# Patient Record
Sex: Male | Born: 1985
Health system: Southern US, Community
[De-identification: ages and names within clinical notes are randomized; demographics above are authoritative.]

## PROBLEM LIST (undated history)

## (undated) ENCOUNTER — Emergency Department: Discharge status: Absent without leave | Payer: Self-pay

## (undated) ENCOUNTER — Emergency Department (HOSPITAL_COMMUNITY): Admission: EM | Payer: Self-pay

## (undated) DIAGNOSIS — J40 Bronchitis, not specified as acute or chronic: Secondary | ICD-10-CM

## (undated) DIAGNOSIS — F1910 Other psychoactive substance abuse, uncomplicated: Secondary | ICD-10-CM

## (undated) DIAGNOSIS — F1010 Alcohol abuse, uncomplicated: Secondary | ICD-10-CM

## (undated) HISTORY — PX: HAND SURGERY: SHX662

---
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
## 2007-06-16 ENCOUNTER — Emergency Department (HOSPITAL_COMMUNITY): Admission: EM | Admit: 2007-06-16 | Discharge: 2007-06-16 | Payer: Self-pay | Admitting: Emergency Medicine

## 2011-02-16 LAB — URINALYSIS, ROUTINE W REFLEX MICROSCOPIC
Glucose, UA: NEGATIVE
Hgb urine dipstick: NEGATIVE
Ketones, ur: NEGATIVE
Nitrite: NEGATIVE
Protein, ur: NEGATIVE
Specific Gravity, Urine: 1.03
Urobilinogen, UA: 2 — ABNORMAL HIGH
pH: 6

## 2011-02-16 LAB — URINE MICROSCOPIC-ADD ON

## 2011-02-16 LAB — GC/CHLAMYDIA PROBE AMP, GENITAL
Chlamydia, DNA Probe: POSITIVE — AB
GC Probe Amp, Genital: POSITIVE — AB

## 2018-01-24 ENCOUNTER — Encounter (HOSPITAL_BASED_OUTPATIENT_CLINIC_OR_DEPARTMENT_OTHER): Payer: Self-pay | Admitting: Emergency Medicine

## 2018-01-24 ENCOUNTER — Emergency Department (HOSPITAL_BASED_OUTPATIENT_CLINIC_OR_DEPARTMENT_OTHER): Payer: Self-pay

## 2018-01-24 ENCOUNTER — Emergency Department (HOSPITAL_BASED_OUTPATIENT_CLINIC_OR_DEPARTMENT_OTHER)
Admission: EM | Admit: 2018-01-24 | Discharge: 2018-01-24 | Disposition: A | Discharge status: Home / Self Care | Payer: Self-pay | Attending: Emergency Medicine | Admitting: Emergency Medicine

## 2018-01-24 ENCOUNTER — Other Ambulatory Visit: Payer: Self-pay

## 2018-01-24 DIAGNOSIS — F17200 Nicotine dependence, unspecified, uncomplicated: Secondary | ICD-10-CM | POA: Insufficient documentation

## 2018-01-24 DIAGNOSIS — J209 Acute bronchitis, unspecified: Secondary | ICD-10-CM | POA: Insufficient documentation

## 2018-01-24 MED ORDER — ALBUTEROL SULFATE HFA 108 (90 BASE) MCG/ACT IN AERS
2.0000 | INHALATION_SPRAY | RESPIRATORY_TRACT | Status: DC | PRN
  Administered 2018-01-24: 2 via RESPIRATORY_TRACT
  Filled 2018-01-24: qty 6.7

## 2018-01-24 MED ORDER — PREDNISONE 10 MG PO TABS: 20.0000 mg | ORAL_TABLET | Freq: Two times a day (BID) | ORAL | 0 refills | Status: DC

## 2018-01-24 MED ORDER — AZITHROMYCIN 250 MG PO TABS: ORAL_TABLET | ORAL | 0 refills | Status: DC

## 2018-01-24 MED ORDER — ALBUTEROL SULFATE (2.5 MG/3ML) 0.083% IN NEBU
5.0000 mg | INHALATION_SOLUTION | Freq: Once | RESPIRATORY_TRACT | Status: AC
  Administered 2018-01-24: 5 mg via RESPIRATORY_TRACT
  Filled 2018-01-24: qty 6

## 2018-01-24 NOTE — ED Notes (Signed)
Patient transported to X-ray 

## 2018-01-24 NOTE — ED Triage Notes (Signed)
Pt reports shob with cough x 4 hours. Pt reports he had inhaler from a month ago, but has lost it.

## 2018-01-24 NOTE — ED Provider Notes (Signed)
[facility] EMERGENCY DEPARTMENT Provider Note   CSN: [id number] Arrival date & time: 01/24/18  0228     History   Chief Complaint Chief Complaint  Patient presents with  . Shortness of Breath    HPI William Miranda is a 32 y.o. male.  Patient is a 32 year old male with no significant medical history.  He presents today with complaints of cough and shortness of breath.  This originally began one month ago and was seen at that time at a hospital in [place].  He was given an inhaler an prednisone.  He was feeling better until this evening when his symptoms returned.  He denies productive cough, fevers.  He has lost he inhaler he was given in [place].  The history is provided by the patient.  Shortness of Breath  This is a recurrent problem. The average episode lasts 1 hour. The problem occurs continuously.The problem has not changed since onset.Associated symptoms include cough. Pertinent negatives include no fever, no sputum production and no chest pain. He has tried nothing for the symptoms.    No past medical history on file.  There are no active problems to display for this patient.   History reviewed. No pertinent surgical history.      Home Medications    Prior to Admission medications   Not on File    Family History No family history on file.  Social History Social History   Tobacco Use  . Smoking status: Current Every Day Smoker  Substance Use Topics  . Alcohol use: Yes  . Drug use: Not on file     Allergies   Patient has no known allergies.   Review of Systems Review of Systems  Constitutional: Negative for fever.  Respiratory: Positive for cough and shortness of breath. Negative for sputum production.   Cardiovascular: Negative for chest pain.  All other systems reviewed and are negative.    Physical Exam Updated Vital Signs BP (!) 114/91 (BP Location: Right Arm)   Pulse 84   Temp 98.4 F (36.9 C) (Oral)   Resp 16   SpO2  100%   Physical Exam  Constitutional: He is oriented to person, place, and time. He appears well-developed and well-nourished. No distress.  HENT:  Head: Normocephalic and atraumatic.  Mouth/Throat: Oropharynx is clear and moist.  Neck: Normal range of motion. Neck supple.  Cardiovascular: Normal rate and regular rhythm. Exam reveals no friction rub.  No murmur heard. Pulmonary/Chest: Effort normal and breath sounds normal. No respiratory distress. He has no wheezes. He has no rales.  Abdominal: Soft. Bowel sounds are normal. He exhibits no distension. There is no tenderness.  Musculoskeletal: Normal range of motion. He exhibits no edema.  Neurological: He is alert and oriented to person, place, and time. Coordination normal.  Skin: Skin is warm and dry. He is not diaphoretic.  Nursing note and vitals reviewed.    ED Treatments / Results  Labs (all labs ordered are listed, but only abnormal results are displayed) Labs Reviewed - No data to display  EKG None  Radiology No results found.  Procedures Procedures (including critical care time)  Medications Ordered in ED Medications  albuterol (PROVENTIL) (2.5 MG/3ML) 0.083% nebulizer solution 5 mg (5 mg Nebulization Given 01/24/18 0250)     Initial Impression / Assessment and Plan / ED Course  I have reviewed the triage vital signs and the nursing notes.  Pertinent labs & imaging results that were available during my care of the  patient were reviewed by me and considered in my medical decision making (see chart for details).  Patient feeling better after breathing treatment.  His chest x-ray is clear.  It appears as though he had only partial treatment of his prior bronchitic illness.  He will be prescribed antibiotics, steroids, and given an additional inhaler.  To return as needed for any problems.  Final Clinical Impressions(s) / ED Diagnoses   Final diagnoses:  None    ED Discharge Orders    None       Geoffery Lyonselo,  Jerni Selmer, MD 01/24/18 [phone]

## 2018-01-24 NOTE — Discharge Instructions (Addendum)
Zithromax and prednisone as prescribed.  Albuterol inhaler: 2 puffs every 4 hours as needed for difficulty breathing.  Follow-up with your primary doctor if symptoms not improving in the next week.

## 2018-05-18 ENCOUNTER — Encounter (HOSPITAL_COMMUNITY): Payer: Self-pay | Admitting: Emergency Medicine

## 2018-05-18 ENCOUNTER — Emergency Department (HOSPITAL_COMMUNITY)
Admission: EM | Admit: 2018-05-18 | Discharge: 2018-05-18 | Disposition: A | Discharge status: Home / Self Care | Payer: Self-pay | Attending: Emergency Medicine | Admitting: Emergency Medicine

## 2018-05-18 ENCOUNTER — Other Ambulatory Visit: Payer: Self-pay

## 2018-05-18 DIAGNOSIS — L03011 Cellulitis of right finger: Secondary | ICD-10-CM

## 2018-05-18 DIAGNOSIS — Z79899 Other long term (current) drug therapy: Secondary | ICD-10-CM | POA: Insufficient documentation

## 2018-05-18 DIAGNOSIS — F17200 Nicotine dependence, unspecified, uncomplicated: Secondary | ICD-10-CM | POA: Insufficient documentation

## 2018-05-18 MED ORDER — CEPHALEXIN 500 MG PO CAPS: 500.0000 mg | ORAL_CAPSULE | Freq: Three times a day (TID) | ORAL | 0 refills | Status: DC

## 2018-05-18 NOTE — Discharge Instructions (Signed)
Soak your finger and antibacterial soapy water multiple times a day.  Keflex as prescribed until all gone for infection.  Follow-up or return if not improving or worsening.

## 2018-05-18 NOTE — ED Provider Notes (Signed)
[facility]-EMERGENCY DEPT Provider Note   CSN: [id number] Arrival date & time: 05/18/18  1943     History   Chief Complaint Chief Complaint  Patient presents with  . Hand Pain    right middle finger  . Insect Bite    HPI William Miranda is a 32 y.o. male.  HPI William Miranda is a 32 y.o. male presents to emergency department with complaint of pain and swelling to the right middle finger.  He states symptoms began several days ago.  He states over the last several days the finger has become more painful and swollen.  There is no drainage.  He is concerned this may be a spider bite.  He denies any fever or chills.  He denies any difficulty bending or moving his finger.  He denies history of the same.  History reviewed. No pertinent past medical history.  There are no active problems to display for this patient.   History reviewed. No pertinent surgical history.      Home Medications    Prior to Admission medications   Medication Sig Start Date End Date Taking? Authorizing Provider  azithromycin (ZITHROMAX Z-PAK) 250 MG tablet 2 po day one, then 1 daily x 4 days 01/24/18   Geoffery Lyonselo, Douglas, MD  cephALEXin (KEFLEX) 500 MG capsule Take 1 capsule (500 mg total) by mouth 3 (three) times daily. 05/18/18   Abdullah Rizzi, Lemont Fillersatyana, PA-C  predniSONE (DELTASONE) 10 MG tablet Take 2 tablets (20 mg total) by mouth 2 (two) times daily. 01/24/18   Geoffery Lyonselo, Douglas, MD    Family History History reviewed. No pertinent family history.  Social History Social History   Tobacco Use  . Smoking status: Current Every Day Smoker  . Smokeless tobacco: Former Engineer, waterUser  Substance Use Topics  . Alcohol use: Yes  . Drug use: Yes     Allergies   Patient has no known allergies.   Review of Systems Review of Systems  Constitutional: Negative for chills and fever.  Musculoskeletal: Positive for arthralgias.  Skin: Positive for color change and wound.  Neurological: Negative for  weakness and numbness.     Physical Exam Updated Vital Signs BP 124/82 (BP Location: Left Arm)   Pulse 95   Temp 98 F (36.7 C) (Oral)   Resp 16   Ht 5\' 11"  (1.803 m)   Wt 90.7 kg   SpO2 98%   BMI 27.89 kg/m   Physical Exam Vitals signs and nursing note reviewed.  Constitutional:      General: He is not in acute distress.    Appearance: He is well-developed.  Eyes:     Conjunctiva/sclera: Conjunctivae normal.  Neck:     Musculoskeletal: Neck supple.  Cardiovascular:     Rate and Rhythm: Normal rate.  Pulmonary:     Effort: No respiratory distress.  Abdominal:     General: There is no distension.  Musculoskeletal:     Comments: Paronychia to the right middle finger.  Finger pad is soft, no concern for felon.  Full range of motion of the finger at each joint.  Cap refill less than 2 seconds distally  Skin:    General: Skin is warm and dry.      ED Treatments / Results  Labs (all labs ordered are listed, but only abnormal results are displayed) Labs Reviewed - No data to display  EKG None  Radiology No results found.  Procedures Drain paronychia Date/Time: 05/18/2018 8:19 PM Performed by: Jaynie CrumbleKirichenko, Carlis Burnsworth, PA-C Authorized  by: Jaynie CrumbleKirichenko, Demetreus Lothamer, PA-C  Consent: Verbal consent obtained. Consent given by: patient Patient understanding: patient states understanding of the procedure being performed Site marked: the operative site was marked Patient identity confirmed: verbally with patient Time out: Immediately prior to procedure a "time out" was called to verify the correct patient, procedure, equipment, support staff and site/side marked as required. Preparation: Patient was prepped and draped in the usual sterile fashion. Local anesthesia used: no  Anesthesia: Local anesthesia used: no  Sedation: Patient sedated: no  Patient tolerance: Patient tolerated the procedure well with no immediate complications    (including critical care  time)  Medications Ordered in ED Medications - No data to display   Initial Impression / Assessment and Plan / ED Course  I have reviewed the triage vital signs and the nursing notes.  Pertinent labs & imaging results that were available during my care of the patient were reviewed by me and considered in my medical decision making (see chart for details).     Patient in emergency department with paronychia to the right middle finger.  Paronychia was drained in emergency department with a large amount of purulent drainage.  Home with wound care, soaks, antibiotic.  Follow-up or return as needed if not improving or worsening.  Patient is otherwise afebrile, nontoxic-appearing.  Stable for discharge home.  Vitals:   05/18/18 1947  BP: 124/82  Pulse: 95  Resp: 16  Temp: 98 F (36.7 C)  TempSrc: Oral  SpO2: 98%  Weight: 90.7 kg  Height: 5\' 11"  (1.803 m)     Final Clinical Impressions(s) / ED Diagnoses   Final diagnoses:  Paronychia of finger of right hand    ED Discharge Orders         Ordered    cephALEXin (KEFLEX) 500 MG capsule  3 times daily     05/18/18 2015           Jaynie CrumbleKirichenko, Nalee Lightle, PA-C 05/18/18 2020    Jacalyn LefevreHaviland, Julie, MD 05/18/18 2307

## 2018-05-18 NOTE — ED Notes (Signed)
ED Provider at bedside. 

## 2018-05-18 NOTE — ED Triage Notes (Signed)
Patient complaining of being bit by a spider on middle finger. The top of patients middle finger is swollen and painful.

## 2018-06-22 ENCOUNTER — Encounter (HOSPITAL_COMMUNITY): Payer: Self-pay | Admitting: *Deleted

## 2018-06-22 ENCOUNTER — Emergency Department (HOSPITAL_COMMUNITY)
Admission: EM | Admit: 2018-06-22 | Discharge: 2018-06-22 | Disposition: A | Discharge status: Home / Self Care | Payer: Self-pay | Attending: Emergency Medicine | Admitting: Emergency Medicine

## 2018-06-22 ENCOUNTER — Emergency Department (HOSPITAL_COMMUNITY): Payer: Self-pay

## 2018-06-22 ENCOUNTER — Other Ambulatory Visit: Payer: Self-pay

## 2018-06-22 DIAGNOSIS — M25512 Pain in left shoulder: Secondary | ICD-10-CM | POA: Insufficient documentation

## 2018-06-22 DIAGNOSIS — Y929 Unspecified place or not applicable: Secondary | ICD-10-CM | POA: Insufficient documentation

## 2018-06-22 DIAGNOSIS — F17290 Nicotine dependence, other tobacco product, uncomplicated: Secondary | ICD-10-CM | POA: Insufficient documentation

## 2018-06-22 DIAGNOSIS — Y999 Unspecified external cause status: Secondary | ICD-10-CM | POA: Insufficient documentation

## 2018-06-22 DIAGNOSIS — W010XXD Fall on same level from slipping, tripping and stumbling without subsequent striking against object, subsequent encounter: Secondary | ICD-10-CM | POA: Insufficient documentation

## 2018-06-22 DIAGNOSIS — Y9302 Activity, running: Secondary | ICD-10-CM | POA: Insufficient documentation

## 2018-06-22 MED ORDER — ACETAMINOPHEN 500 MG PO TABS: 500.0000 mg | ORAL_TABLET | Freq: Four times a day (QID) | ORAL | 0 refills | Status: DC | PRN

## 2018-06-22 MED ORDER — IBUPROFEN 600 MG PO TABS: 600.0000 mg | ORAL_TABLET | Freq: Four times a day (QID) | ORAL | 0 refills | Status: DC | PRN

## 2018-06-22 NOTE — ED Notes (Signed)
Patient states that in the summer he had fail. He states that it started hurting 2 days ago. Patient states that he has not taken anything for it. Patient states that it just got worse.

## 2018-06-22 NOTE — Discharge Instructions (Signed)
1. Medications: Alternate 600 mg of ibuprofen and [phone] mg of Tylenol every 3 hours as needed for pain. Do not exceed 4000 mg of Tylenol daily.  Take ibuprofen with food to avoid upset stomach issues.  2. Treatment: rest, ice, elevate and use shoulder sling (but be sure to take your arm out of it 2-3 times daily and stretch to avoid muscle stiffness), drink plenty of fluids, gentle stretching (see attached exercises but you can also YouTube search shoulder or acromioclavicular physical therapy exercises) 3. Follow Up: Please followup with orthopedics as directed or your PCP in 1 week if no improvement for discussion of your diagnoses and further evaluation after today's visit; if you do not have a primary care doctor use the resource guide provided to find one; Please return to the ER for worsening symptoms or other concerns such as worsening swelling, redness of the skin, fevers, loss of pulses, or loss of feeling

## 2018-06-22 NOTE — ED Provider Notes (Signed)
[facility]-EMERGENCY DEPT Provider Note   CSN: [id number] Arrival date & time: 06/22/18  4665     History   Chief Complaint Chief Complaint  Patient presents with  . Shoulder Pain    left    HPI William Miranda is a 33 y.o. male with no significant past medical history presents for evaluation of acute onset, intermittent left shoulder pain for 2 days.  Patient reports that approximately 6 months ago he fell while running with his hands behind his back, landing on the anterior aspect of his left shoulder.  He did not have any long-lasting pain at that time but 2 days ago developed pain to the anterior aspect of the left shoulder.  Pain is sharp, occurs with any upward movement/flexion of the left shoulder.  He denies fevers, chest pain or shortness of breath, or neck pain.  No numbness, tingling, or weakness.  He has not tried anything for his symptoms.  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  The history is provided by the patient.    History reviewed. No pertinent past medical history.  There are no active problems to display for this patient.   Past Surgical History:  Procedure Laterality Date  . HAND SURGERY     right        Home Medications    Prior to Admission medications   Medication Sig Start Date End Date Taking? Authorizing Provider  acetaminophen (TYLENOL) 500 MG tablet Take 1 tablet (500 mg total) by mouth every 6 (six) hours as needed. 06/22/18   Eusebio Blazejewski A, PA-C  ibuprofen (ADVIL,MOTRIN) 600 MG tablet Take 1 tablet (600 mg total) by mouth every 6 (six) hours as needed. 06/22/18   Jeanie Sewer, PA-C    Family History No family history on file.  Social History Social History   Tobacco Use  . Smoking status: Current Every Day Smoker    Packs/day: 0.50    Types: Cigars  . Smokeless tobacco: Never Used  Substance Use Topics  . Alcohol use: Not Currently  . Drug use: Yes    Types: Marijuana     Allergies     Patient has no allergy information on record.   Review of Systems Review of Systems  Constitutional: Negative for fever.  Respiratory: Negative for shortness of breath.   Cardiovascular: Negative for chest pain.  Musculoskeletal: Positive for arthralgias.  Neurological: Negative for weakness and numbness.  All other systems reviewed and are negative.    Physical Exam Updated Vital Signs BP (!) 116/95 (BP Location: Left Arm)   Pulse 71   Temp 98.2 F (36.8 C) (Oral)   Resp 16   Ht 5\' 11"  (1.803 m)   Wt 90.7 kg   SpO2 100%   BMI 27.89 kg/m   Physical Exam Vitals signs and nursing note reviewed.  Constitutional:      General: He is not in acute distress.    Appearance: He is well-developed.  HENT:     Head: Normocephalic and atraumatic.  Eyes:     General:        Right eye: No discharge.        Left eye: No discharge.     Conjunctiva/sclera: Conjunctivae normal.  Neck:     Musculoskeletal: Normal range of motion and neck supple. No neck rigidity.     Vascular: No JVD.     Trachea: No tracheal deviation.  Cardiovascular:     Rate and Rhythm: Normal rate.  Pulses: Normal pulses.     Comments: 2+ radial pulses bilaterally Pulmonary:     Effort: Pulmonary effort is normal.  Abdominal:     General: There is no distension.  Musculoskeletal: Normal range of motion.        General: Tenderness present.     Comments: Tenderness to palpation overlying the left acromioclavicular joint.  No erythema, warmth, deformity, or crepitus noted to the left shoulder.  Normal active and passive range of motion of the left shoulder with pain elicited with flexion and abduction but not internal rotation.  Negative empty can sign, negative Hawkins impingement test.  Positive crossover test.  5/5 strength of BUE major muscle groups  Lymphadenopathy:     Cervical: No cervical adenopathy.  Skin:    Findings: No erythema.  Neurological:     Mental Status: He is alert.     Comments:  Fluent speech, no facial droop, sensation intact to soft touch of bilateral upper extremities.  Psychiatric:        Behavior: Behavior normal.      ED Treatments / Results  Labs (all labs ordered are listed, but only abnormal results are displayed) Labs Reviewed - No data to display  EKG None  Radiology Dg Shoulder Left  Result Date: 06/22/2018 CLINICAL DATA:  Patient fell landing on left shoulder in July, 2019 during arrest. Patient 2 weeks ago started having generalized left shoulder pain without known injury. EXAM: LEFT SHOULDER - 2+ VIEW COMPARISON:  None. FINDINGS: Osteoarthritis of the acromioclavicular joint with sclerosis of the distal clavicle and minimal spurring noted about the [facility] joint. No joint dislocation or fracture. No intra-articular loose bodies. The adjacent ribs and lung are nonacute. IMPRESSION: AC joint osteoarthritis. No acute osseous abnormality. Electronically Signed   By: Tollie Ethavid  Kwon M.D.   On: 06/22/2018 03:12    Procedures Procedures (including critical care time)  Medications Ordered in ED Medications - No data to display   Initial Impression / Assessment and Plan / ED Course  I have reviewed the triage vital signs and the nursing notes.  Pertinent labs & imaging results that were available during my care of the patient were reviewed by me and considered in my medical decision making (see chart for details).     Patient presenting for evaluation of left shoulder pain for 2 days. Fall 6 months ago. He is afebrile, vital signs are stable.  He is nontoxic in appearance.  He is neurovascularly intact.  Pain is focal overlying the left acromioclavicular joint and radiographs show evidence of AC joint osteoarthritis but no evidence of acute osseous abnormality including fracture or dislocation.  Doubt septic joint, osteomyelitis, or DVT.  Conservative therapy indicated and discussed with patient.  He was placed in a shoulder sling for comfort but advised to  do some gentle stretching to avoid muscle stiffness.  Recommend follow-up with orthopedist or PCP for reevaluation of symptoms.  Discussed strict ED return precautions. Pt verbalized understanding of and agreement with plan and is safe for discharge home at this time.   Final Clinical Impressions(s) / ED Diagnoses   Final diagnoses:  Arthralgia of left acromioclavicular joint  Acute pain of left shoulder    ED Discharge Orders         Ordered    ibuprofen (ADVIL,MOTRIN) 600 MG tablet  Every 6 hours PRN     06/22/18 0627    acetaminophen (TYLENOL) 500 MG tablet  Every 6 hours PRN     06/22/18 [id number]  Jeanie Sewer, PA-C 06/22/18 7412    Derwood Kaplan, MD 06/26/18 1510

## 2018-06-22 NOTE — ED Triage Notes (Signed)
Pt c/o left shoulder pain x 2 days, denies trauma/injury.

## 2018-07-13 ENCOUNTER — Encounter (HOSPITAL_COMMUNITY): Payer: Self-pay

## 2018-07-13 ENCOUNTER — Emergency Department (HOSPITAL_COMMUNITY)
Admission: EM | Admit: 2018-07-13 | Discharge: 2018-07-13 | Disposition: A | Discharge status: Home / Self Care | Payer: Self-pay | Attending: Emergency Medicine | Admitting: Emergency Medicine

## 2018-07-13 DIAGNOSIS — Z79899 Other long term (current) drug therapy: Secondary | ICD-10-CM | POA: Insufficient documentation

## 2018-07-13 DIAGNOSIS — R062 Wheezing: Secondary | ICD-10-CM | POA: Insufficient documentation

## 2018-07-13 DIAGNOSIS — F17210 Nicotine dependence, cigarettes, uncomplicated: Secondary | ICD-10-CM | POA: Insufficient documentation

## 2018-07-13 MED ORDER — ALBUTEROL SULFATE HFA 108 (90 BASE) MCG/ACT IN AERS
2.0000 | INHALATION_SPRAY | Freq: Four times a day (QID) | RESPIRATORY_TRACT | Status: DC | PRN
  Administered 2018-07-13: 2 via RESPIRATORY_TRACT
  Filled 2018-07-13: qty 6.7

## 2018-07-13 NOTE — ED Provider Notes (Signed)
[facility] EMERGENCY DEPARTMENT Provider Note   CSN: [id number] Arrival date & time: 07/13/18  1703     History   Chief Complaint Chief Complaint  Patient presents with  . Cough    HPI Jarom Rosenstock is a 33 y.o. male.  Patient is a 33 year old male who presents requesting a refill of his albuterol.  He states over the last few years he has noticed some wheezing from time to time.  He says it got worse a couple times during the summer and was diagnosed with bronchitis and he was dispensed an albuterol inhaler which she has been using with some improvement in symptoms.  He never did take his antibiotics which were prescribed previously.  He says that he started using his albuterol inhaler again couple weeks ago due to some wheezing but he ran out of it and is requesting a new one.  He denies any current shortness of breath.  No chest pain.  He is a smoker.  He does not have a primary care physician but is interested in obtaining one within the next 2 to 3 weeks when his insurance kicks in.  He denies any leg pain or swelling.  No fevers.     History reviewed. No pertinent past medical history.  There are no active problems to display for this patient.   History reviewed. No pertinent surgical history.      Home Medications    Prior to Admission medications   Medication Sig Start Date End Date Taking? Authorizing Provider  azithromycin (ZITHROMAX Z-PAK) 250 MG tablet 2 po day one, then 1 daily x 4 days 01/24/18   Geoffery Lyons, MD  cephALEXin (KEFLEX) 500 MG capsule Take 1 capsule (500 mg total) by mouth 3 (three) times daily. 05/18/18   Kirichenko, Lemont Fillers, PA-C  predniSONE (DELTASONE) 10 MG tablet Take 2 tablets (20 mg total) by mouth 2 (two) times daily. 01/24/18   Geoffery Lyons, MD    Family History History reviewed. No pertinent family history.  Social History Social History   Tobacco Use  . Smoking status: Current Every Day Smoker  . Smokeless  tobacco: Former Engineer, water Use Topics  . Alcohol use: Yes  . Drug use: Yes     Allergies   Patient has no known allergies.   Review of Systems Review of Systems  Constitutional: Negative for chills, diaphoresis, fatigue and fever.  HENT: Negative for congestion, rhinorrhea and sneezing.   Eyes: Negative.   Respiratory: Positive for shortness of breath and wheezing. Negative for cough and chest tightness.   Cardiovascular: Negative for chest pain and leg swelling.  Gastrointestinal: Negative for abdominal pain, blood in stool, diarrhea, nausea and vomiting.  Genitourinary: Negative for difficulty urinating, flank pain, frequency and hematuria.  Musculoskeletal: Negative for arthralgias and back pain.  Skin: Negative for rash.  Neurological: Negative for dizziness, speech difficulty, weakness, numbness and headaches.     Physical Exam Updated Vital Signs BP 126/77 (BP Location: Right Arm)   Pulse 88   Temp 98.8 F (37.1 C) (Oral)   Resp 17   Ht 5\' 10"  (1.778 m)   Wt 89.8 kg   SpO2 100%   BMI 28.41 kg/m   Physical Exam Constitutional:      Appearance: He is well-developed.  HENT:     Head: Normocephalic and atraumatic.  Eyes:     Pupils: Pupils are equal, round, and reactive to light.  Neck:     Musculoskeletal: Normal range of  motion and neck supple.  Cardiovascular:     Rate and Rhythm: Normal rate and regular rhythm.     Heart sounds: Normal heart sounds.  Pulmonary:     Effort: Pulmonary effort is normal. No respiratory distress.     Breath sounds: Normal breath sounds. No wheezing or rales.  Chest:     Chest wall: No tenderness.  Abdominal:     General: Bowel sounds are normal.     Palpations: Abdomen is soft.     Tenderness: There is no abdominal tenderness. There is no guarding or rebound.  Musculoskeletal: Normal range of motion.  Lymphadenopathy:     Cervical: No cervical adenopathy.  Skin:    General: Skin is warm and dry.     Findings: No  rash.  Neurological:     Mental Status: He is alert and oriented to person, place, and time.      ED Treatments / Results  Labs (all labs ordered are listed, but only abnormal results are displayed) Labs Reviewed - No data to display  EKG None  Radiology No results found.  Procedures Procedures (including critical care time)  Medications Ordered in ED Medications  albuterol (PROVENTIL HFA;VENTOLIN HFA) 108 (90 Base) MCG/ACT inhaler 2 puff (has no administration in time range)     Initial Impression / Assessment and Plan / ED Course  I have reviewed the triage vital signs and the nursing notes.  Pertinent labs & imaging results that were available during my care of the patient were reviewed by me and considered in my medical decision making (see chart for details).     Patient is a well-appearing male who presents with intermittent wheezing.  He does not have any symptoms of an acute infection.  His lungs are currently clear to auscultation.  I did counsel him in smoking cessation.  He was dispensed an albuterol inhaler.  He was encouraged to establish care with a PCP.  Return precautions were given.  Final Clinical Impressions(s) / ED Diagnoses   Final diagnoses:  Wheezing    ED Discharge Orders    None       Rolan Bucco, MD 07/13/18 1752

## 2018-07-13 NOTE — ED Triage Notes (Signed)
Pt reports dx of bronchitis in the summer, was prescribed inhaler and antibiotics. Pt reports never took antibiotics. Pt reports he didn't start using inhaler until 1.5 months ago. Pt reports sharing it with someone else and needs inhaler refill. Pt states he is having a cough and some SOB. Pt alert, oriented, and ambulatory.

## 2018-07-22 ENCOUNTER — Emergency Department (HOSPITAL_BASED_OUTPATIENT_CLINIC_OR_DEPARTMENT_OTHER)
Admission: EM | Admit: 2018-07-22 | Discharge: 2018-07-23 | Disposition: A | Discharge status: Home / Self Care | Payer: Self-pay | Attending: Emergency Medicine | Admitting: Emergency Medicine

## 2018-07-22 ENCOUNTER — Other Ambulatory Visit: Payer: Self-pay

## 2018-07-22 ENCOUNTER — Encounter (HOSPITAL_BASED_OUTPATIENT_CLINIC_OR_DEPARTMENT_OTHER): Payer: Self-pay | Admitting: *Deleted

## 2018-07-22 DIAGNOSIS — L0291 Cutaneous abscess, unspecified: Secondary | ICD-10-CM

## 2018-07-22 DIAGNOSIS — L739 Follicular disorder, unspecified: Secondary | ICD-10-CM

## 2018-07-22 DIAGNOSIS — F17290 Nicotine dependence, other tobacco product, uncomplicated: Secondary | ICD-10-CM | POA: Insufficient documentation

## 2018-07-22 DIAGNOSIS — Z79899 Other long term (current) drug therapy: Secondary | ICD-10-CM | POA: Insufficient documentation

## 2018-07-22 DIAGNOSIS — L0231 Cutaneous abscess of buttock: Secondary | ICD-10-CM | POA: Insufficient documentation

## 2018-07-22 HISTORY — DX: Bronchitis, not specified as acute or chronic: J40

## 2018-07-22 MED ORDER — LIDOCAINE-EPINEPHRINE (PF) 2 %-1:200000 IJ SOLN
INTRAMUSCULAR | Status: AC
  Filled 2018-07-22: qty 10

## 2018-07-22 MED ORDER — LIDOCAINE-EPINEPHRINE (PF) 2 %-1:200000 IJ SOLN
20.0000 mL | Freq: Once | INTRAMUSCULAR | Status: AC
  Administered 2018-07-22: 20 mL
  Filled 2018-07-22: qty 20

## 2018-07-22 NOTE — ED Triage Notes (Signed)
Pt reports 2 abscess on buttocks x 3-4 days

## 2018-07-22 NOTE — ED Provider Notes (Signed)
[facility] EMERGENCY DEPARTMENT Provider Note   CSN: [id number] Arrival date & time: 07/22/18  2103    History   Chief Complaint Chief Complaint  Patient presents with  . Abscess    HPI William Miranda is a 33 y.o. male who presents today for evaluation of multiple abscesses.  He reports that over the past week he has had multiple boils develop under his arms, on his buttocks and in his groin.  He reports that he was recently seen at [facility] where he had aspiration of possible abscess performed.  He reports that he was given a prescription for antibiotics however has not yet filled them.  He denies any fevers.  He denies any IV drug use.  He does report daily use of ecstasy.  No fevers.  No nausea vomiting or diarrhea.  He reports that otherwise he has been well.     HPI  Past Medical History:  Diagnosis Date  . Bronchitis     There are no active problems to display for this patient.   Past Surgical History:  Procedure Laterality Date  . HAND SURGERY          Home Medications    Prior to Admission medications   Medication Sig Start Date End Date Taking? Authorizing Provider  azithromycin (ZITHROMAX Z-PAK) 250 MG tablet 2 po day one, then 1 daily x 4 days 01/24/18   Geoffery Lyonselo, Douglas, MD  cephALEXin (KEFLEX) 500 MG capsule Take 1 capsule (500 mg total) by mouth 3 (three) times daily. 05/18/18   Kirichenko, Lemont Fillersatyana, PA-C  predniSONE (DELTASONE) 10 MG tablet Take 2 tablets (20 mg total) by mouth 2 (two) times daily. 01/24/18   Geoffery Lyonselo, Douglas, MD    Family History No family history on file.  Social History Social History   Tobacco Use  . Smoking status: Current Every Day Smoker    Types: Cigars  . Smokeless tobacco: Former Engineer, waterUser  Substance Use Topics  . Alcohol use: Yes    Comment: occasional  . Drug use: Yes    Comment: "x" pills     Allergies   Patient has no known allergies.   Review of Systems Review of Systems  Constitutional: Negative for  chills and fever.  Gastrointestinal: Negative for abdominal pain, nausea and vomiting.  Skin: Positive for color change and rash.  All other systems reviewed and are negative.    Physical Exam Updated Vital Signs BP (!) 135/99   Pulse 98   Temp 98.2 F (36.8 C) (Oral)   Resp 16   Ht 5' 10.5" (1.791 m)   Wt 90.7 kg   SpO2 100%   BMI 28.29 kg/m   Physical Exam Vitals signs and nursing note reviewed.  Constitutional:      General: He is not in acute distress.    Appearance: He is well-developed. He is not diaphoretic.     Comments: Initially very sleepy, once awakened was able to interact appropriately.  HENT:     Head: Normocephalic and atraumatic.  Eyes:     General: No scleral icterus.       Right eye: No discharge.        Left eye: No discharge.     Conjunctiva/sclera: Conjunctivae normal.  Neck:     Musculoskeletal: Normal range of motion.  Cardiovascular:     Rate and Rhythm: Normal rate and regular rhythm.     Pulses: Normal pulses.  Pulmonary:     Effort: Pulmonary effort is normal. No respiratory  distress.     Breath sounds: No stridor.  Abdominal:     General: There is no distension.  Musculoskeletal:        General: No deformity.  Skin:    General: Skin is warm and dry.     Comments: There are multiple areas of mild induration consistent with superficial infection.   The area that is most painful is on the left sided medial buttock which has a central pustule and approximately 4 x 4 area of induration with fluctuance approximately 1 cm in the middle.  Neurological:     General: No focal deficit present.     Mental Status: He is alert and oriented to person, place, and time.     Motor: No abnormal muscle tone.  Psychiatric:        Mood and Affect: Mood normal.        Behavior: Behavior normal.      ED Treatments / Results  Labs (all labs ordered are listed, but only abnormal results are displayed) Labs Reviewed - No data to  display  EKG None  Radiology No results found.  Procedures .Marland KitchenIncision and Drainage Date/Time: 07/23/2018 12:06 AM Performed by: Cristina Gong, PA-C Authorized by: Cristina Gong, PA-C   Consent:    Consent obtained:  Verbal   Consent given by:  Patient   Risks discussed:  Bleeding, incomplete drainage, pain and infection (Damage to other structures, need for additional procedures)   Alternatives discussed:  No treatment, alternative treatment and referral Location:    Type:  Abscess   Size:  1.5cm   Location:  Lower extremity   Lower extremity location:  Buttock   Buttock location:  L buttock Pre-procedure details:    Skin preparation:  Chloraprep Anesthesia (see MAR for exact dosages):    Anesthesia method:  Local infiltration   Local anesthetic:  Lidocaine 2% WITH epi Procedure type:    Complexity:  Complex Procedure details:    Incision types:  Stab incision   Incision depth:  Subcutaneous   Scalpel blade:  11   Wound management:  Probed and deloculated and irrigated with saline   Drainage:  Purulent   Drainage amount:  Scant   Wound treatment:  Wound left open   Packing materials:  None Post-procedure details:    Patient tolerance of procedure:  Tolerated well, no immediate complications Ultrasound ED Soft Tissue Date/Time: 07/23/2018 12:07 AM Performed by: Cristina Gong, PA-C Authorized by: Cristina Gong, PA-C   Procedure details:    Indications: localization of abscess     Transverse view:  Visualized   Longitudinal view:  Visualized   Images: archived   Location:    Location: buttocks     Side:  Left Findings:     abscess present    cellulitis present   (including critical care time)  Medications Ordered in ED Medications  lidocaine-EPINEPHrine (XYLOCAINE W/EPI) 2 %-1:200000 (PF) injection (  Canceled Entry 07/22/18 2357)  lidocaine-EPINEPHrine (XYLOCAINE W/EPI) 2 %-1:200000 (PF) injection 20 mL (20 mLs Infiltration Given  07/22/18 2257)     Initial Impression / Assessment and Plan / ED Course  I have reviewed the triage vital signs and the nursing notes.  Pertinent labs & imaging results that were available during my care of the patient were reviewed by me and considered in my medical decision making (see chart for details).       Patient with skin abscess amenable to incision and drainage.  Abscess was not  large enough to warrant packing or drain,  wound recheck in 2 days. Encouraged home warm soaks and flushing.  Mild signs of cellulitis is surrounding skin.  Will d/c to home.  He was previously given antibiotics yesterday for the same.  He reports that he still has this prescription.  Encouraged him to fill the prescription.  Given how he was initially drowsy with reports of not sleeping due to ecstasy use, I do not feel that narcotic pain medicine is in this patient's best interest.  Therefore recommended over-the-counter pain medicine and conservative care.  Return precautions were discussed with patient who states their understanding.  At the time of discharge patient denied any unaddressed complaints or concerns.  Patient is agreeable for discharge home.   Final Clinical Impressions(s) / ED Diagnoses   Final diagnoses:  Abscess  Folliculitis    ED Discharge Orders    None       Norman Clay 07/23/18 0018    Gilda Crease, MD 07/23/18 [phone]

## 2018-07-22 NOTE — Discharge Instructions (Signed)
Please get your prescriptions filled.  If you develop fevers or worsening symptoms please seek additional medical care and evaluation.  Please take Ibuprofen (Advil, motrin) and Tylenol (acetaminophen) to relieve your pain.  You may take up to 600 MG (3 pills) of normal strength ibuprofen every 8 hours as needed.  In between doses of ibuprofen you make take tylenol, up to 1,000 mg (two extra strength pills).  Do not take more than 3,000 mg tylenol in a 24 hour period.  Please check all medication labels as many medications such as pain and cold medications may contain tylenol.  Do not drink alcohol while taking these medications.  Do not take other NSAID'S while taking ibuprofen (such as aleve or naproxen).  Please take ibuprofen with food to decrease stomach upset.

## 2018-07-28 ENCOUNTER — Other Ambulatory Visit: Payer: Self-pay

## 2018-07-28 ENCOUNTER — Encounter (HOSPITAL_BASED_OUTPATIENT_CLINIC_OR_DEPARTMENT_OTHER): Payer: Self-pay | Admitting: Emergency Medicine

## 2018-07-28 ENCOUNTER — Emergency Department (HOSPITAL_BASED_OUTPATIENT_CLINIC_OR_DEPARTMENT_OTHER)
Admission: EM | Admit: 2018-07-28 | Discharge: 2018-07-28 | Disposition: A | Discharge status: Home / Self Care | Payer: Self-pay | Attending: Emergency Medicine | Admitting: Emergency Medicine

## 2018-07-28 ENCOUNTER — Emergency Department (HOSPITAL_BASED_OUTPATIENT_CLINIC_OR_DEPARTMENT_OTHER): Payer: Self-pay

## 2018-07-28 DIAGNOSIS — R0602 Shortness of breath: Secondary | ICD-10-CM | POA: Insufficient documentation

## 2018-07-28 DIAGNOSIS — Z5321 Procedure and treatment not carried out due to patient leaving prior to being seen by health care provider: Secondary | ICD-10-CM | POA: Insufficient documentation

## 2018-07-28 NOTE — ED Triage Notes (Signed)
Reports shortness of breath since last night.  Ambulatory to triage in NAD.  Speaking in full sentences without difficulty.

## 2018-07-28 NOTE — ED Notes (Signed)
Called x 3 no answer 

## 2018-07-28 NOTE — ED Notes (Signed)
Pt did not answer x 1 

## 2018-07-28 NOTE — ED Notes (Signed)
CALLED X 2 NO ANSWER ?

## 2018-07-29 ENCOUNTER — Other Ambulatory Visit: Payer: Self-pay

## 2018-07-29 DIAGNOSIS — E876 Hypokalemia: Secondary | ICD-10-CM | POA: Insufficient documentation

## 2018-07-29 DIAGNOSIS — J069 Acute upper respiratory infection, unspecified: Secondary | ICD-10-CM | POA: Insufficient documentation

## 2018-07-29 DIAGNOSIS — F17290 Nicotine dependence, other tobacco product, uncomplicated: Secondary | ICD-10-CM | POA: Insufficient documentation

## 2018-07-30 ENCOUNTER — Emergency Department (HOSPITAL_COMMUNITY): Payer: Self-pay

## 2018-07-30 ENCOUNTER — Emergency Department (HOSPITAL_COMMUNITY)
Admission: EM | Admit: 2018-07-30 | Discharge: 2018-07-30 | Disposition: A | Discharge status: Home / Self Care | Payer: Self-pay | Attending: Emergency Medicine | Admitting: Emergency Medicine

## 2018-07-30 ENCOUNTER — Other Ambulatory Visit: Payer: Self-pay

## 2018-07-30 ENCOUNTER — Encounter (HOSPITAL_COMMUNITY): Payer: Self-pay | Admitting: Emergency Medicine

## 2018-07-30 DIAGNOSIS — J069 Acute upper respiratory infection, unspecified: Secondary | ICD-10-CM

## 2018-07-30 DIAGNOSIS — E876 Hypokalemia: Secondary | ICD-10-CM

## 2018-07-30 LAB — BASIC METABOLIC PANEL
Anion gap: 9 (ref 5–15)
BUN: 15 mg/dL (ref 6–20)
CHLORIDE: 103 mmol/L (ref 98–111)
CO2: 23 mmol/L (ref 22–32)
CREATININE: 0.95 mg/dL (ref 0.61–1.24)
Calcium: 8.6 mg/dL — ABNORMAL LOW (ref 8.9–10.3)
GFR calc Af Amer: >60 mL/min (ref >60)
GFR calc non Af Amer: >60 mL/min (ref >60)
Glucose, Bld: 92 mg/dL (ref 70–99)
Potassium: 3.1 mmol/L — ABNORMAL LOW (ref 3.5–5.1)
SODIUM: 135 mmol/L (ref 135–145)

## 2018-07-30 LAB — CBC
HCT: 44 % (ref 39.0–52.0)
HEMOGLOBIN: 14.1 g/dL (ref 13.0–17.0)
MCH: 28.5 pg (ref 26.0–34.0)
MCHC: 32 g/dL (ref 30.0–36.0)
MCV: 89.1 fL (ref 80.0–100.0)
PLATELETS: 218 10*3/uL (ref 150–400)
RBC: 4.94 MIL/uL (ref 4.22–5.81)
RDW: 13 % (ref 11.5–15.5)
WBC: 6.6 10*3/uL (ref 4.0–10.5)
nRBC: 0 % (ref 0.0–0.2)

## 2018-07-30 MED ORDER — POTASSIUM CHLORIDE CRYS ER 20 MEQ PO TBCR
40.0000 meq | EXTENDED_RELEASE_TABLET | Freq: Once | ORAL | Status: AC
  Administered 2018-07-30: 40 meq via ORAL
  Filled 2018-07-30: qty 2

## 2018-07-30 MED ORDER — IBUPROFEN 800 MG PO TABS
800.0000 mg | ORAL_TABLET | Freq: Once | ORAL | Status: AC
  Administered 2018-07-30: 800 mg via ORAL
  Filled 2018-07-30: qty 1

## 2018-07-30 MED ORDER — BENZONATATE 100 MG PO CAPS
100.0000 mg | ORAL_CAPSULE | Freq: Once | ORAL | Status: AC
  Administered 2018-07-30: 100 mg via ORAL
  Filled 2018-07-30: qty 1

## 2018-07-30 MED ORDER — IBUPROFEN 600 MG PO TABS: 600.0000 mg | ORAL_TABLET | Freq: Four times a day (QID) | ORAL | 0 refills | Status: DC | PRN

## 2018-07-30 MED ORDER — BENZONATATE 100 MG PO CAPS: 100.0000 mg | ORAL_CAPSULE | Freq: Three times a day (TID) | ORAL | 0 refills | Status: DC | PRN

## 2018-07-30 NOTE — Discharge Instructions (Signed)
Your recent chest x-ray was reassuring and did not show any evidence of pneumonia.  Your blood work today was normal with the exception of a slightly low potassium.  You were given a potassium supplement in the emergency department prior to discharge.  We recommend Tylenol and ibuprofen for management of low-grade fever as well as body aches.  You have been prescribed Tessalon to take for cough management.  You may continue with other over-the-counter remedies, if desired.  Follow-up with a primary care doctor to ensure resolution of symptoms.
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
## 2018-07-30 NOTE — ED Triage Notes (Addendum)
Patient is complaining of diarrhea, nausea, generalized body aches, hot and cold, and feeling weak. Patient states this has been going on for 3 days. Patient has been dx with bronchitis.

## 2018-07-30 NOTE — ED Provider Notes (Signed)
[facility]-EMERGENCY DEPT Provider Note   CSN: [id number] Arrival date & time: 07/29/18  2355    History   Chief Complaint Chief Complaint  Patient presents with  . Fever  . Generalized Body Aches    HPI William Miranda is a 33 y.o. male.    33 year old male presents to the emergency department for evaluation of upper respiratory symptoms.  He has been experiencing fatigue as well as generalized body aches over the past 3 days.  Notes associated nasal congestion as well as cough.  He has been experiencing some discomfort in his central chest with coughing.  Also reports a few episodes of watery, nonbloody diarrhea.  He has been using Mucinex for symptoms with little relief.  Reports prior history of bronchitis over the summer.  Was prescribed antibiotics at this time, but did not complete them.  Denies any known sick contacts.  Endorsing subjective fever, but is afebrile in the ED.  Denies taking antipyretics prior to arrival.  The history is provided by the patient. No language interpreter was used.  Fever    Past Medical History:  Diagnosis Date  . Bronchitis     There are no active problems to display for this patient.   Past Surgical History:  Procedure Laterality Date  . HAND SURGERY          Home Medications    Prior to Admission medications   Medication Sig Start Date End Date Taking? Authorizing Provider  albuterol (PROVENTIL) (2.5 MG/3ML) 0.083% nebulizer solution Take 2.5 mg by nebulization every 6 (six) hours as needed for wheezing or shortness of breath.   Yes [provider]  PE-diphenhydrAMINE-DM-GG-APAP (MUCINEX FAST-MAX DAY/NIGHT PO) Take 30 mLs by mouth 2 (two) times daily.   Yes [provider]  benzonatate (TESSALON) 100 MG capsule Take 1 capsule (100 mg total) by mouth 3 (three) times daily as needed for cough. 07/30/18   Antony Madura, PA-C  ibuprofen (ADVIL,MOTRIN) 600 MG tablet Take 1 tablet (600 mg total) by  mouth every 6 (six) hours as needed for mild pain, moderate pain or cramping. 07/30/18   Antony Madura, PA-C    Family History History reviewed. No pertinent family history.  Social History Social History   Tobacco Use  . Smoking status: Current Every Day Smoker    Types: Cigars  . Smokeless tobacco: Former Engineer, water Use Topics  . Alcohol use: Yes    Comment: occasional  . Drug use: Yes    Comment: "x" pills     Allergies   Patient has no known allergies.   Review of Systems Review of Systems  Constitutional: Positive for fever.  Ten systems reviewed and are negative for acute change, except as noted in the HPI.    Physical Exam Updated Vital Signs BP (!) 130/93 (BP Location: Left Arm)   Pulse 93   Temp 100 F (37.8 C) (Oral)   Resp 17   Ht 5' 11.5" (1.816 m)   Wt 90.7 kg   SpO2 98%   BMI 27.51 kg/m   Physical Exam Vitals signs and nursing note reviewed.  Constitutional:      General: He is not in acute distress.    Appearance: He is well-developed. He is not diaphoretic.     Comments: Nontoxic appearing and in NAD  HENT:     Head: Normocephalic and atraumatic.     Nose: Congestion present. No rhinorrhea.     Mouth/Throat:     Comments: Oropharynx  clear.  Normal phonation.  Tolerating secretions without difficulty Eyes:     General: No scleral icterus.    Conjunctiva/sclera: Conjunctivae normal.  Neck:     Musculoskeletal: Normal range of motion.  Cardiovascular:     Rate and Rhythm: Normal rate and regular rhythm.     Pulses: Normal pulses.  Pulmonary:     Effort: Pulmonary effort is normal. No respiratory distress.     Breath sounds: No stridor. No wheezing, rhonchi or rales.     Comments: Sporadic dry cough.  Lungs clear to auscultation bilaterally Musculoskeletal: Normal range of motion.  Skin:    General: Skin is warm and dry.     Coloration: Skin is not pale.     Findings: No erythema or rash.  Neurological:     Mental Status: He is  alert and oriented to person, place, and time.     Coordination: Coordination normal.  Psychiatric:        Behavior: Behavior normal.      ED Treatments / Results  Labs (all labs ordered are listed, but only abnormal results are displayed) Labs Reviewed  BASIC METABOLIC PANEL - Abnormal; Notable for the following components:      Result Value   Potassium 3.1 (*)    Calcium 8.6 (*)    All other components within normal limits  CBC    EKG None  Radiology Dg Chest 2 View  Result Date: 07/28/2018 CLINICAL DATA:  Shortness of breath with fever and weakness for a few days. Nonsmoker. EXAM: CHEST - 2 VIEW COMPARISON:  Radiographs 01/24/2018 and 02/18/2017. FINDINGS: The heart size and mediastinal contours are normal. The lungs are clear. There is no pleural effusion or pneumothorax. No acute osseous findings are identified. IMPRESSION: Stable chest.  No active cardiopulmonary process. Electronically Signed   By: Carey Bullocks M.D.   On: 07/28/2018 11:39    Procedures Procedures (including critical care time)  Medications Ordered in ED Medications  potassium chloride SA (K-DUR,KLOR-CON) CR tablet 40 mEq (has no administration in time range)  ibuprofen (ADVIL,MOTRIN) tablet 800 mg (800 mg Oral Given 07/30/18 0222)  benzonatate (TESSALON) capsule 100 mg (100 mg Oral Given 07/30/18 0222)     Initial Impression / Assessment and Plan / ED Course  I have reviewed the triage vital signs and the nursing notes.  Pertinent labs & imaging results that were available during my care of the patient were reviewed by me and considered in my medical decision making (see chart for details).        Patient's CXR negative for acute infiltrate. No fever, hypoxia. Lungs CTAB on my assessment. Symptoms are consistent with URI, likely viral etiology. Discussed that antibiotics are not indicated for viral infections. Patient will be discharged with symptomatic treatment. Return precautions provided.  Patient discharged in stable condition with no unaddressed concerns.   Final Clinical Impressions(s) / ED Diagnoses   Final diagnoses:  URI with cough and congestion  Hypokalemia    ED Discharge Orders         Ordered    ibuprofen (ADVIL,MOTRIN) 600 MG tablet  Every 6 hours PRN     07/30/18 0303    benzonatate (TESSALON) 100 MG capsule  3 times daily PRN     07/30/18 0303           Antony Madura, PA-C 07/30/18 0306    Molpus, Jonny Ruiz, MD 07/30/18 [phone]

## 2018-08-19 ENCOUNTER — Other Ambulatory Visit: Payer: Self-pay

## 2018-08-19 ENCOUNTER — Encounter (HOSPITAL_COMMUNITY): Payer: Self-pay | Admitting: Emergency Medicine

## 2018-08-19 ENCOUNTER — Emergency Department (HOSPITAL_COMMUNITY)
Admission: EM | Admit: 2018-08-19 | Discharge: 2018-08-19 | Disposition: A | Discharge status: Home / Self Care | Payer: Self-pay | Attending: Emergency Medicine | Admitting: Emergency Medicine

## 2018-08-19 DIAGNOSIS — H5712 Ocular pain, left eye: Secondary | ICD-10-CM

## 2018-08-19 DIAGNOSIS — F17290 Nicotine dependence, other tobacco product, uncomplicated: Secondary | ICD-10-CM | POA: Insufficient documentation

## 2018-08-19 DIAGNOSIS — H538 Other visual disturbances: Secondary | ICD-10-CM

## 2018-08-19 MED ORDER — FLUORESCEIN SODIUM 1 MG OP STRP
1.0000 | ORAL_STRIP | Freq: Once | OPHTHALMIC | Status: AC
  Administered 2018-08-19: 1 via OPHTHALMIC
  Filled 2018-08-19: qty 1

## 2018-08-19 MED ORDER — OXYCODONE-ACETAMINOPHEN 5-325 MG PO TABS: 1.0000 | ORAL_TABLET | Freq: Four times a day (QID) | ORAL | 0 refills | Status: DC | PRN

## 2018-08-19 MED ORDER — TETRACAINE HCL 0.5 % OP SOLN
1.0000 [drp] | Freq: Once | OPHTHALMIC | Status: AC
  Administered 2018-08-19: 1 [drp] via OPHTHALMIC
  Filled 2018-08-19: qty 4

## 2018-08-19 MED ORDER — OXYCODONE-ACETAMINOPHEN 5-325 MG PO TABS
1.0000 | ORAL_TABLET | Freq: Once | ORAL | Status: AC
  Administered 2018-08-19: 1 via ORAL
  Filled 2018-08-19: qty 1

## 2018-08-19 NOTE — ED Notes (Signed)
Patient verbalizes understanding of discharge instructions. Opportunity for questioning and answers were provided. Armband removed by staff, pt discharged from ED. Ambulated out to lobby  

## 2018-08-19 NOTE — Discharge Instructions (Addendum)
Please use saline eyedrops, pain medication as needed.  Follow-up with Dr. Cammie Mcgee office today to schedule your appointment for tomorrow.  You may return to the emergency room if you develop any new or worsening signs or symptoms.

## 2018-08-19 NOTE — ED Notes (Signed)
Patient transported to CT 
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
## 2018-08-29 ENCOUNTER — Emergency Department (HOSPITAL_COMMUNITY)
Admission: EM | Admit: 2018-08-29 | Discharge: 2018-08-29 | Disposition: A | Discharge status: Home / Self Care | Payer: Self-pay | Attending: Emergency Medicine | Admitting: Emergency Medicine

## 2018-08-29 ENCOUNTER — Emergency Department (HOSPITAL_COMMUNITY): Payer: Self-pay

## 2018-08-29 ENCOUNTER — Encounter (HOSPITAL_COMMUNITY): Payer: Self-pay | Admitting: Emergency Medicine

## 2018-08-29 ENCOUNTER — Other Ambulatory Visit: Payer: Self-pay

## 2018-08-29 DIAGNOSIS — R0789 Other chest pain: Secondary | ICD-10-CM | POA: Insufficient documentation

## 2018-08-29 DIAGNOSIS — Z79899 Other long term (current) drug therapy: Secondary | ICD-10-CM | POA: Insufficient documentation

## 2018-08-29 DIAGNOSIS — R109 Unspecified abdominal pain: Secondary | ICD-10-CM | POA: Insufficient documentation

## 2018-08-29 DIAGNOSIS — F17210 Nicotine dependence, cigarettes, uncomplicated: Secondary | ICD-10-CM | POA: Insufficient documentation

## 2018-08-29 LAB — BASIC METABOLIC PANEL
Anion gap: 10 (ref 5–15)
BUN: 13 mg/dL (ref 6–20)
CO2: 23 mmol/L (ref 22–32)
Calcium: 9.4 mg/dL (ref 8.9–10.3)
Chloride: 105 mmol/L (ref 98–111)
Creatinine, Ser: 0.95 mg/dL (ref 0.61–1.24)
GFR calc Af Amer: >60 mL/min (ref >60)
GFR calc non Af Amer: >60 mL/min (ref >60)
Glucose, Bld: 85 mg/dL (ref 70–99)
Potassium: 3.7 mmol/L (ref 3.5–5.1)
Sodium: 138 mmol/L (ref 135–145)

## 2018-08-29 LAB — CBC WITH DIFFERENTIAL/PLATELET
Abs Immature Granulocytes: 0.07 10*3/uL (ref 0.00–0.07)
Basophils Absolute: 0.1 10*3/uL (ref 0.0–0.1)
Basophils Relative: 1 %
Eosinophils Absolute: 0.5 10*3/uL (ref 0.0–0.5)
Eosinophils Relative: 5 %
HCT: 43.9 % (ref 39.0–52.0)
Hemoglobin: 14.4 g/dL (ref 13.0–17.0)
Immature Granulocytes: 1 %
Lymphocytes Relative: 42 %
Lymphs Abs: 4.5 10*3/uL — ABNORMAL HIGH (ref 0.7–4.0)
MCH: 29 pg (ref 26.0–34.0)
MCHC: 32.8 g/dL (ref 30.0–36.0)
MCV: 88.5 fL (ref 80.0–100.0)
Monocytes Absolute: 1 10*3/uL (ref 0.1–1.0)
Monocytes Relative: 9 %
Neutro Abs: 4.4 10*3/uL (ref 1.7–7.7)
Neutrophils Relative %: 42 %
Platelets: 209 10*3/uL (ref 150–400)
RBC: 4.96 MIL/uL (ref 4.22–5.81)
RDW: 13.6 % (ref 11.5–15.5)
WBC: 10.5 10*3/uL (ref 4.0–10.5)
nRBC: 0 % (ref 0.0–0.2)

## 2018-08-29 LAB — URINALYSIS, ROUTINE W REFLEX MICROSCOPIC
Bacteria, UA: NONE SEEN
Bilirubin Urine: NEGATIVE
Glucose, UA: NEGATIVE mg/dL
Ketones, ur: NEGATIVE mg/dL
Leukocytes,Ua: NEGATIVE
Nitrite: NEGATIVE
Protein, ur: NEGATIVE mg/dL
Specific Gravity, Urine: 1.025 (ref 1.005–1.030)
pH: 5 (ref 5.0–8.0)

## 2018-08-29 LAB — TROPONIN I: Troponin I: <0.03 ng/mL (ref <0.03)

## 2018-08-29 MED ORDER — KETOROLAC TROMETHAMINE 30 MG/ML IJ SOLN
30.0000 mg | Freq: Once | INTRAMUSCULAR | Status: AC
  Administered 2018-08-29: 08:00:00 30 mg via INTRAVENOUS
  Filled 2018-08-29: qty 1

## 2018-08-29 MED ORDER — SODIUM CHLORIDE 0.9 % IV BOLUS
1000.0000 mL | Freq: Once | INTRAVENOUS | Status: AC
  Administered 2018-08-29: 08:00:00 1000 mL via INTRAVENOUS

## 2018-08-29 NOTE — ED Provider Notes (Signed)
[facility] EMERGENCY DEPARTMENT Provider Note   CSN: [id number] Arrival date & time: 08/29/18  2355    History   Chief Complaint Chief Complaint  Patient presents with  . Flank Pain  . Chest Pain    HPI Doneal Kusner is a 33 y.o. male.    33 year old male with no significant past medical history presents to the emergency department for multiple symptoms.  States that he has been experiencing a pain to his right flank over the past few days which has been fairly constant.  It is aggravated slightly with deep breathing.  When he finished voiding at 1400 today he reports feeling a sharp pain in his penis which radiated up the left side of his abdomen into his chest and left side of his neck.  States that he has had persistent, constant pain to the left side of his chest since this time which continues to radiate to his neck.  He has not taken any medications for pain.  Denies difficulty voiding, syncope, fevers, vomiting, bowel changes, abdominal pain.  No history of abdominal surgeries.  Denies remote or recent ETOH/substance use.  He endorses use of tobacco products.  The history is provided by the patient. No language interpreter was used.  Flank Pain  Associated symptoms include chest pain.  Chest Pain    History reviewed. No pertinent past medical history.  There are no active problems to display for this patient.   Past Surgical History:  Procedure Laterality Date  . HAND SURGERY     right        Home Medications    Prior to Admission medications   Medication Sig Start Date End Date Taking? Authorizing Provider  acetaminophen (TYLENOL) 500 MG tablet Take 1 tablet (500 mg total) by mouth every 6 (six) hours as needed. 06/22/18   Fawze, Mina A, PA-C  ibuprofen (ADVIL,MOTRIN) 600 MG tablet Take 1 tablet (600 mg total) by mouth every 6 (six) hours as needed. 06/22/18   Jeanie Sewer, PA-C    Family History History reviewed. No pertinent family history.   Social History Social History   Tobacco Use  . Smoking status: Current Every Day Smoker    Packs/day: 0.50    Types: Cigars  . Smokeless tobacco: Never Used  Substance Use Topics  . Alcohol use: Not Currently  . Drug use: Yes    Types: Marijuana     Allergies   Patient has no allergy information on record.   Review of Systems Review of Systems  Cardiovascular: Positive for chest pain.  Genitourinary: Positive for flank pain.  Ten systems reviewed and are negative for acute change, except as noted in the HPI.    Physical Exam Updated Vital Signs BP 118/85   Pulse 67   Temp 97.8 F (36.6 C) (Oral)   Resp (!) 22   Ht 5\' 11"  (1.803 m)   Wt 86.2 kg   SpO2 100%   BMI 26.50 kg/m   Physical Exam Vitals signs and nursing note reviewed.  Constitutional:      General: He is not in acute distress.    Appearance: He is well-developed. He is not diaphoretic.     Comments: Nontoxic appearing and in NAD  HENT:     Head: Normocephalic and atraumatic.     Mouth/Throat:     Mouth: Mucous membranes are moist.     Comments: Clear oropharynx.  Tolerating secretions without difficulty. Eyes:     General: No scleral  icterus.    Conjunctiva/sclera: Conjunctivae normal.  Neck:     Musculoskeletal: Normal range of motion.  Cardiovascular:     Rate and Rhythm: Normal rate and regular rhythm.     Pulses: Normal pulses.  Pulmonary:     Effort: Pulmonary effort is normal. No respiratory distress.     Breath sounds: No stridor. No wheezing, rhonchi or rales.     Comments: Respirations even and unlabored.  Lungs clear to auscultation bilaterally. Abdominal:     General: There is no distension.     Palpations: There is no mass.     Tenderness: There is no abdominal tenderness. There is no guarding.     Comments: Soft, nontender, nondistended abdomen  Musculoskeletal: Normal range of motion.  Skin:    General: Skin is warm and dry.     Coloration: Skin is not pale.      Findings: No erythema or rash.  Neurological:     Mental Status: He is alert and oriented to person, place, and time.     Coordination: Coordination normal.     Comments: Moving all extremities spontaneously.  Psychiatric:        Behavior: Behavior normal.      ED Treatments / Results  Labs (all labs ordered are listed, but only abnormal results are displayed) Labs Reviewed  URINALYSIS, ROUTINE W REFLEX MICROSCOPIC - Abnormal; Notable for the following components:      Result Value   Hgb urine dipstick SMALL (*)    All other components within normal limits  CBC WITH DIFFERENTIAL/PLATELET - Abnormal; Notable for the following components:   Lymphs Abs 4.5 (*)    All other components within normal limits  BASIC METABOLIC PANEL  TROPONIN I    EKG None  Radiology No results found.  Procedures Procedures (including critical care time)  Medications Ordered in ED Medications - No data to display   Initial Impression / Assessment and Plan / ED Course  I have reviewed the triage vital signs and the nursing notes.  Pertinent labs & imaging results that were available during my care of the patient were reviewed by me and considered in my medical decision making (see chart for details).        Patient presenting to the emergency department for evaluation of right-sided flank pain as well as left-sided chest pain.  He has a reassuring physical exam, is afebrile with stable vital signs.  Remainder of work-up pending.  Patient signed out to Jodi Geralds, PA-C at change of shift who will reassess and disposition appropriately.   Final Clinical Impressions(s) / ED Diagnoses   Final diagnoses:  Atypical chest pain  Flank pain    ED Discharge Orders    None       Antony Madura, PA-C 08/30/18 9211    Nira Conn, MD 09/01/18 2135

## 2018-08-29 NOTE — Discharge Instructions (Signed)
Your work-up today is reassuring, labs and chest x-ray look good I do not think this is an acute problem with your heart and your urine does not show signs of an infection or kidney stone.  Please make sure you are drinking plenty of water, you may use Tylenol as needed for pain and follow-up with your primary care doctor if symptoms are persisting.  You have worsening pain in the chest or flank, fevers, vomiting, shortness of breath or any other new or concerning symptoms return for reevaluation.

## 2018-08-29 NOTE — ED Triage Notes (Signed)
  Patient comes in with bilateral flank pain that has been going on for a few days.  Patient says when he finishes urinating he feels a sharp pain up his side and into his chest. Patient states he does feel tightness in his chest for a few hours and has hx of bronchitis.  Patient is afebrile and has not been exposed to his knowledge.  Patient is A&O x4. Pain 6/10.

## 2018-08-29 NOTE — ED Notes (Signed)
Signature pad not working in room. Pt given discharge instructions, follow up information and the opportunity to ask questions. Pt verbalized understanding.

## 2018-08-29 NOTE — ED Provider Notes (Signed)
Care assumed from PA [facility], please see her note for full details, but in brief William Miranda is a 33 y.o. male who presents complaining of pain over the right chest and flank as well as pain radiating from his genitals up into his left chest and neck.  Right-sided pain is been present for a few days and left-sided pain began suddenly yesterday.  He does report an associated tightness in his chest.  Left-sided pain began suddenly after voiding.  No associated shortness of breath.  Initial work-up reassuring, no leukocytosis, normal hemoglobin, no acute electrolyte derangements, negative troponin and EKG with early re-pole pattern, no other concerning changes.  Awaiting urinalysis and chest x-ray.  Patient has not had any fevers or associated sick contacts.  Plan:  Follow-up on urinalysis and chest x-ray, if unremarkable patient can be discharged home with PCP follow-up, if urinalysis shows signs of hematuria patient may need renal CT to rule out kidney stone.  Labs Reviewed  URINALYSIS, ROUTINE W REFLEX MICROSCOPIC - Abnormal; Notable for the following components:      Result Value   Hgb urine dipstick SMALL (*)    All other components within normal limits  CBC WITH DIFFERENTIAL/PLATELET - Abnormal; Notable for the following components:   Lymphs Abs 4.5 (*)    All other components within normal limits  BASIC METABOLIC PANEL  TROPONIN I   Imaging Dg Chest 2 View  Result Date: 08/29/2018 CLINICAL DATA:  Chest pain. EXAM: CHEST - 2 VIEW COMPARISON:  None. FINDINGS: The heart size and mediastinal contours are within normal limits. Both lungs are clear. The visualized skeletal structures are unremarkable. IMPRESSION: Negative.  No active cardiopulmonary disease. Electronically Signed   By: Myles Rosenthal M.D.   On: 08/29/2018 08:21     EKG  EKG Interpretation  Date/Time:  Friday August 29 2018 07:30:26 EDT Ventricular Rate:  59 PR Interval:    QRS Duration: 94 QT Interval:  415 QTC  Calculation: 412 R Axis:   -166 Text Interpretation:  Sinus rhythm S1,S2,S3 pattern no STEMI, ST segmennt diffusely elevated suggestive of early repol,no old comarison. Confirmed by Arby Barrette [phone]) on 08/29/2018 7:37:03 AM       MDM  33 year old male presenting with right-sided flank and chest pain and pain radiating from his genitals to his chest after urination.  Labs are reassuring, urinalysis does not show hematuria to suggest renal stone and there are no signs of infection.  EKG with signs of early re-pole, negative troponin.  Pain is very atypical and I have low suspicion for ACS, patient is PERC negative, PE unlikely.  Presentation is not suggestive of dissection.  On my exam patient is laying comfortably and reports minimal to no discomfort at this time.  Chest x-ray is clear.  After fluids and Toradol patient reports he is feeling better.  At this time feel patient is stable for discharge home with close follow-up with his primary care doctor.  Strict return precautions discussed.  Patient expresses understanding and agreement with plan.  Discharged home in good condition.   Final diagnoses:  Atypical chest pain  Flank pain    ED Discharge Orders    None        Dartha Lodge, [place] 08/29/18 0848    Arby Barrette, MD 08/30/18 1112

## 2018-09-18 ENCOUNTER — Encounter (HOSPITAL_BASED_OUTPATIENT_CLINIC_OR_DEPARTMENT_OTHER): Payer: Self-pay | Admitting: Emergency Medicine

## 2018-09-18 ENCOUNTER — Other Ambulatory Visit: Payer: Self-pay

## 2018-09-18 ENCOUNTER — Emergency Department (HOSPITAL_BASED_OUTPATIENT_CLINIC_OR_DEPARTMENT_OTHER)
Admission: EM | Admit: 2018-09-18 | Discharge: 2018-09-18 | Disposition: A | Discharge status: Home / Self Care | Payer: Self-pay | Attending: Emergency Medicine | Admitting: Emergency Medicine

## 2018-09-18 DIAGNOSIS — R369 Urethral discharge, unspecified: Secondary | ICD-10-CM

## 2018-09-18 DIAGNOSIS — Z113 Encounter for screening for infections with a predominantly sexual mode of transmission: Secondary | ICD-10-CM | POA: Insufficient documentation

## 2018-09-18 DIAGNOSIS — F17290 Nicotine dependence, other tobacco product, uncomplicated: Secondary | ICD-10-CM | POA: Insufficient documentation

## 2018-09-18 LAB — URINALYSIS, ROUTINE W REFLEX MICROSCOPIC
Bilirubin Urine: NEGATIVE
Glucose, UA: NEGATIVE mg/dL
Ketones, ur: NEGATIVE mg/dL
Nitrite: NEGATIVE
Protein, ur: NEGATIVE mg/dL
Specific Gravity, Urine: 1.025 (ref 1.005–1.030)
pH: 6 (ref 5.0–8.0)

## 2018-09-18 LAB — URINALYSIS, MICROSCOPIC (REFLEX): WBC, UA: >50 WBC/hpf (ref 0–5)

## 2018-09-18 MED ORDER — CEFTRIAXONE SODIUM 250 MG IJ SOLR
250.0000 mg | Freq: Once | INTRAMUSCULAR | Status: AC
  Administered 2018-09-18: 250 mg via INTRAMUSCULAR
  Filled 2018-09-18: qty 250

## 2018-09-18 MED ORDER — AZITHROMYCIN 1 G PO PACK
1.0000 g | PACK | Freq: Once | ORAL | Status: AC
  Administered 2018-09-18: 1 g via ORAL
  Filled 2018-09-18: qty 1

## 2018-09-18 MED ORDER — AZITHROMYCIN 250 MG PO TABS: 1000.0000 mg | ORAL_TABLET | Freq: Once | ORAL | Status: DC

## 2018-09-18 MED ORDER — METRONIDAZOLE 500 MG PO TABS
2000.0000 mg | ORAL_TABLET | Freq: Once | ORAL | Status: AC
  Administered 2018-09-18: 2000 mg via ORAL
  Filled 2018-09-18: qty 4

## 2018-09-18 MED ORDER — CEPHALEXIN 500 MG PO CAPS: 500.0000 mg | ORAL_CAPSULE | Freq: Two times a day (BID) | ORAL | 0 refills | Status: DC

## 2018-09-18 NOTE — Discharge Instructions (Addendum)
You have been empirically treated today for gonorrhea, chlamydia, trichomonas.  Your STD screening tests are pending including HIV and syphilis testing.  If these are positive, you will be contacted.  Your urine appears infected today which I suspect is from a sexually transmitted disease but we are treating you as if you have a urinary tract infection as well with Keflex.  Please avoid sexual intercourse for at least 7 days after treatment completed.   Steps to find a Primary Care Provider (PCP):  Call [phone] or [phone] to access "Price Find a Doctor Service."  2.  You may also go on the [facility] website at [email or web address]  3.  [facility] also frequently accepts new patients.  [facility]  [address] [place] [address] [phone]  4.  There are also multiple [facility], [facility] and [facility] practices throughout [place] that are frequently accepting new patients. You may find a clinic that is close to your home and contact them.  [facility] [email or web address] [phone]  [facility] [email or web address]  [email or web address] [phone]  [facility] [email or web address] [phone]  5.  Local Health Departments also can provide primary care services.  [facility]  [address] [place] [address] [phone]  [facility] Department [address] [place] [address] [phone]  [facility] [address] [place] [address]  [place] [address] [phone]

## 2018-09-18 NOTE — ED Provider Notes (Signed)
TIME SEEN: 5:24 AM  CHIEF COMPLAINT: Penile discharge  HPI: Patient is a 33 year old male with no significant past medical history who presents to the emergency department with complaints of penile discharge for the past several days and diarrhea.  He states it is painful, burning when he urinates.  He is concerned that he could have a sexually transmitted disease.  Denies previous history of STDs.  Sexually active right now with one new male partner and they do not have protected sex.  No testicular pain or swelling.  No fevers, cough, chest pain or shortness of breath.  No nausea or vomiting.  ROS: See HPI Constitutional: no fever  Eyes: no drainage  ENT: no runny nose   Cardiovascular:  no chest pain  Resp: no SOB  GI: no vomiting GU: no dysuria Integumentary: no rash  Allergy: no hives  Musculoskeletal: no leg swelling  Neurological: no slurred speech ROS otherwise negative  PAST MEDICAL HISTORY/PAST SURGICAL HISTORY:  Past Medical History:  Diagnosis Date  . Bronchitis     MEDICATIONS:  Prior to Admission medications   Not on File    ALLERGIES:  No Known Allergies  SOCIAL HISTORY:  Social History   Tobacco Use  . Smoking status: Current Every Day Smoker    Types: Cigars  . Smokeless tobacco: Former Engineer, water Use Topics  . Alcohol use: Yes    Comment: occasional    FAMILY HISTORY: No family history on file.  EXAM: BP (!) 129/93 (BP Location: Right Arm)   Pulse 77   Temp 98.2 F (36.8 C) (Oral)   Resp 16   Ht 5\' 11"  (1.803 m)   Wt 86.2 kg   SpO2 100%   BMI 26.50 kg/m  CONSTITUTIONAL: Alert and oriented and responds appropriately to questions. Well-appearing; well-nourished HEAD: Normocephalic EYES: Conjunctivae clear, pupils appear equal, EOMI ENT: normal nose; moist mucous membranes NECK: Supple, normal range of motion CARD: RRR RESP: Normal chest excursion without splinting or tachypnea; no hypoxia or respiratory distress, speaking full  sentences ABD/GI: Normal bowel sounds; non-distended; soft, non-tender, no rebound, no guarding, no peritoneal signs, no hepatosplenomegaly GU:  Normal external genitalia, circumcised male, normal penile shaft, no blood or discharge at the urethral meatus, no testicular masses or tenderness on exam, no scrotal masses or swelling, no hernias appreciated, 2+ femoral pulses bilaterally; no perineal erythema, warmth, subcutaneous air or crepitus; no high riding testicle, normal bilateral cremasteric reflex.  Chaperone present for exam. BACK:  The back appears normal and is non-tender to palpation, there is no CVA tenderness EXT: Normal ROM in all joints; no major deformity noted SKIN: Normal color for age and race; warm; no rash on exposed skin NEURO: Moves all extremities equally, normal gait, normal speech PSYCH: The patient's mood and manner are appropriate. Grooming and personal hygiene are appropriate.  MEDICAL DECISION MAKING: Patient here with penile discharge.  Have offered him GC chlamydia testing with a urethral swab which he declines.  Will obtain urine sample, urine culture.  He is requesting empiric treatment for gonorrhea and chlamydia.  Have offered HIV and syphilis testing today which he agrees to.  Abdominal exam benign.  Genital exam benign.  No sign of testicular torsion, mass.  No sign of balanitis.  Doubt appendicitis, kidney stone, pyelonephritis.  ED PROGRESS: Patient's urine shows trace leukocyte esterase, greater than 50 white blood cells with rare bacteria.  I suspect this is from his penile discharge.  His girlfriend who is also here being tested  and treated just tested positive for trichomonas.  This information has not been given to the patient but we will empirically treat him with Flagyl.  Urine culture is pending.  Will discharge with prescription of Keflex as well in case this is a UTI given he is complaining of dysuria.  Gonorrhea, chlamydia, HIV and syphilis testing pending.   Given outpatient STD clinic follow-up information.   At this time, I do not feel there is any life-threatening condition present. I have reviewed and discussed all results (EKG, imaging, lab, urine as appropriate) and exam findings with patient/family. I have reviewed nursing notes and appropriate previous records.  I feel the patient is safe to be discharged home without further emergent workup and can continue workup as an outpatient as needed. Discussed usual and customary return precautions. Patient/family verbalize understanding and are comfortable with this plan.  Outpatient follow-up has been provided as needed. All questions have been answered.      Yaniris Braddock, Layla MawKristen N, DO 09/18/18 [phone]

## 2018-09-18 NOTE — ED Triage Notes (Signed)
Pt reports penile discharge x 2 days. Pt states he recently had unprotected sex.

## 2018-09-19 LAB — GC/CHLAMYDIA PROBE AMP (~~LOC~~) NOT AT ARMC
Chlamydia: NEGATIVE
Neisseria Gonorrhea: NEGATIVE

## 2018-09-19 LAB — HIV ANTIBODY (ROUTINE TESTING W REFLEX): HIV Screen 4th Generation wRfx: NONREACTIVE

## 2018-09-19 LAB — URINE CULTURE: Culture: NO GROWTH

## 2018-09-19 LAB — RPR: RPR Ser Ql: NONREACTIVE

## 2018-10-05 ENCOUNTER — Telehealth (HOSPITAL_BASED_OUTPATIENT_CLINIC_OR_DEPARTMENT_OTHER): Payer: Self-pay | Admitting: Emergency Medicine

## 2018-10-05 MED ORDER — CEPHALEXIN 500 MG PO CAPS: 500.0000 mg | ORAL_CAPSULE | Freq: Two times a day (BID) | ORAL | 0 refills | Status: DC

## 2018-10-05 NOTE — Telephone Encounter (Signed)
Patient states he lost his prescription for Keflex and is requesting a refill.

## 2018-10-13 ENCOUNTER — Other Ambulatory Visit: Payer: Self-pay

## 2018-10-13 ENCOUNTER — Encounter (HOSPITAL_BASED_OUTPATIENT_CLINIC_OR_DEPARTMENT_OTHER): Payer: Self-pay | Admitting: Emergency Medicine

## 2018-10-13 ENCOUNTER — Emergency Department (HOSPITAL_BASED_OUTPATIENT_CLINIC_OR_DEPARTMENT_OTHER)
Admission: EM | Admit: 2018-10-13 | Discharge: 2018-10-13 | Disposition: A | Discharge status: Home / Self Care | Payer: Self-pay | Attending: Emergency Medicine | Admitting: Emergency Medicine

## 2018-10-13 DIAGNOSIS — F17200 Nicotine dependence, unspecified, uncomplicated: Secondary | ICD-10-CM | POA: Insufficient documentation

## 2018-10-13 DIAGNOSIS — R369 Urethral discharge, unspecified: Secondary | ICD-10-CM

## 2018-10-13 DIAGNOSIS — Z79899 Other long term (current) drug therapy: Secondary | ICD-10-CM | POA: Insufficient documentation

## 2018-10-13 LAB — URINALYSIS, ROUTINE W REFLEX MICROSCOPIC
Bilirubin Urine: NEGATIVE
Glucose, UA: NEGATIVE mg/dL
Ketones, ur: NEGATIVE mg/dL
Leukocytes,Ua: NEGATIVE
Nitrite: NEGATIVE
Protein, ur: NEGATIVE mg/dL
Specific Gravity, Urine: >1.03 — ABNORMAL HIGH (ref 1.005–1.030)
pH: 5.5 (ref 5.0–8.0)

## 2018-10-13 LAB — URINALYSIS, MICROSCOPIC (REFLEX)

## 2018-10-13 MED ORDER — DOXYCYCLINE HYCLATE 100 MG PO CAPS: 100.0000 mg | ORAL_CAPSULE | Freq: Two times a day (BID) | ORAL | 0 refills | Status: DC

## 2018-10-13 NOTE — ED Provider Notes (Signed)
[facility] EMERGENCY DEPARTMENT Provider Note   CSN: [id number] Arrival date & time: 10/13/18  3662    History   Chief Complaint Chief Complaint  Patient presents with  . Cough  . Penile Discharge    HPI William Miranda is a 33 y.o. male.     HPI  This is a 33 year old male who presents with concerns for penile discharge.  He was seen and evaluated at the end of April.  At that time he complained of the same.  He was tested and treated for STDs.  His STD testing came back negative including gonorrhea, chlamydia, RPR, HIV.  He reports the same sexual partner.  He continues to not use condoms.  He denies any fevers, nausea, vomiting, other symptoms.  He does report chronic cough but he states that he is not as concerned about this.  Patient reports that he took medications as prescribed.  He was discharged with Keflex to cover for UTI.  His urine culture was negative.  He states that his symptoms have not gotten any better.  He denies any back pain or fevers.  I reviewed his chart.  He was treated with azithromycin, Flagyl, and Rocephin at appropriate doses and had negative testing.  Past Medical History:  Diagnosis Date  . Bronchitis     There are no active problems to display for this patient.   Past Surgical History:  Procedure Laterality Date  . HAND SURGERY          Home Medications    Prior to Admission medications   Medication Sig Start Date End Date Taking? Authorizing Provider  cephALEXin (KEFLEX) 500 MG capsule Take 1 capsule (500 mg total) by mouth 2 (two) times daily. 10/05/18   Molpus, John, MD  doxycycline (VIBRAMYCIN) 100 MG capsule Take 1 capsule (100 mg total) by mouth 2 (two) times daily. 10/13/18   Jomel Whittlesey, Mayer Masker, MD    Family History History reviewed. No pertinent family history.  Social History Social History   Tobacco Use  . Smoking status: Current Every Day Smoker    Types: Cigars  . Smokeless tobacco: Former Engineer, water  Use Topics  . Alcohol use: Yes    Comment: occasional  . Drug use: Yes    Comment: "x" pills (ecstasy "every day")     Allergies   Patient has no known allergies.   Review of Systems Review of Systems  Constitutional: Negative for fever.  Gastrointestinal: Negative for abdominal pain, nausea and vomiting.  Genitourinary: Positive for penile swelling. Negative for dysuria.  Musculoskeletal: Negative for back pain.  All other systems reviewed and are negative.    Physical Exam Updated Vital Signs BP (!) 124/98 (BP Location: Right Arm)   Pulse 90   Temp 98.6 F (37 C) (Oral)   Resp 17   Ht 1.803 m (5\' 11" )   Wt 90.7 kg   SpO2 98%   BMI 27.89 kg/m   Physical Exam Vitals signs and nursing note reviewed.  Constitutional:      Appearance: He is well-developed. He is not ill-appearing.  HENT:     Head: Normocephalic and atraumatic.  Cardiovascular:     Rate and Rhythm: Normal rate and regular rhythm.  Pulmonary:     Effort: Pulmonary effort is normal. No respiratory distress.  Abdominal:     Palpations: Abdomen is soft.     Tenderness: There is no abdominal tenderness.  Genitourinary:    Comments: Normal circumcised penis, no lesions noted,  no discharge noted Skin:    General: Skin is warm and dry.  Neurological:     Mental Status: He is alert and oriented to person, place, and time.  Psychiatric:        Mood and Affect: Mood normal.      ED Treatments / Results  Labs (all labs ordered are listed, but only abnormal results are displayed) Labs Reviewed  URINALYSIS, ROUTINE W REFLEX MICROSCOPIC - Abnormal; Notable for the following components:      Result Value   APPearance HAZY (*)    Specific Gravity, Urine >1.030 (*)    Hgb urine dipstick TRACE (*)    All other components within normal limits  URINALYSIS, MICROSCOPIC (REFLEX) - Abnormal; Notable for the following components:   Bacteria, UA MANY (*)    All other components within normal limits  URINE  CULTURE  GC/CHLAMYDIA PROBE AMP ([place]    EKG None  Radiology No results found.  Procedures Procedures (including critical care time)  Medications Ordered in ED Medications - No data to display   Initial Impression / Assessment and Plan / ED Course  I have reviewed the triage vital signs and the nursing notes.  Pertinent labs & imaging results that were available during my care of the patient were reviewed by me and considered in my medical decision making (see chart for details).        Patient presents with ongoing penile discharge.  He is overall nontoxic-appearing and vital signs are reassuring.  He appears to have been appropriately treated for gonorrhea and chlamydia as well as Trichomonas.  He was also empirically treated for UTI; however, his urine culture was negative.  Repeat urinalysis does show persistent leukocytes and now with many bacteria.  I have resent gonorrhea and Chlamydia testing.  Given persistence of symptoms, will treat with a seven-day course of doxycycline.  After history, exam, and medical workup I feel the patient has been appropriately medically screened and is safe for discharge home. Pertinent diagnoses were discussed with the patient. Patient was given return precautions.  Final Clinical Impressions(s) / ED Diagnoses   Final diagnoses:  Penile discharge    ED Discharge Orders         Ordered    doxycycline (VIBRAMYCIN) 100 MG capsule  2 times daily     10/13/18 0419           Samuella Rasool, Mayer Maskerourtney F, MD 10/13/18 [phone]

## 2018-10-13 NOTE — Discharge Instructions (Addendum)
You were reevaluated today for penile discharge.  Your gonorrhea and Chlamydia testing was recent.  You will be placed on doxycycline in the event that the prior test was a false negative.  Abstain from sexual activity.  Always use condoms.

## 2018-10-13 NOTE — ED Triage Notes (Signed)
Pt states recent tx for presumed STI. States all tests came back normal but sx haver persisted. C/o penile dc and burning. Pt also mentions productive cough, denies fever, SHOB. States he was seen "in [place]" was told he had "mucous in his lung" given medicine and never took it.

## 2018-10-14 LAB — URINE CULTURE: Culture: <10000 — AB

## 2018-10-16 LAB — GC/CHLAMYDIA PROBE AMP (~~LOC~~) NOT AT ARMC
Chlamydia: NEGATIVE
Neisseria Gonorrhea: POSITIVE — AB

## 2019-01-09 ENCOUNTER — Other Ambulatory Visit: Payer: Self-pay

## 2019-01-09 ENCOUNTER — Encounter (HOSPITAL_BASED_OUTPATIENT_CLINIC_OR_DEPARTMENT_OTHER): Payer: Self-pay | Admitting: *Deleted

## 2019-01-09 ENCOUNTER — Emergency Department (HOSPITAL_BASED_OUTPATIENT_CLINIC_OR_DEPARTMENT_OTHER)
Admission: EM | Admit: 2019-01-09 | Discharge: 2019-01-09 | Disposition: A | Discharge status: Home / Self Care | Payer: Self-pay | Attending: Emergency Medicine | Admitting: Emergency Medicine

## 2019-01-09 DIAGNOSIS — F17290 Nicotine dependence, other tobacco product, uncomplicated: Secondary | ICD-10-CM | POA: Insufficient documentation

## 2019-01-09 DIAGNOSIS — R369 Urethral discharge, unspecified: Secondary | ICD-10-CM | POA: Insufficient documentation

## 2019-01-09 DIAGNOSIS — F1510 Other stimulant abuse, uncomplicated: Secondary | ICD-10-CM | POA: Insufficient documentation

## 2019-01-09 DIAGNOSIS — F1610 Hallucinogen abuse, uncomplicated: Secondary | ICD-10-CM | POA: Insufficient documentation

## 2019-01-09 DIAGNOSIS — N3000 Acute cystitis without hematuria: Secondary | ICD-10-CM | POA: Insufficient documentation

## 2019-01-09 DIAGNOSIS — K2970 Gastritis, unspecified, without bleeding: Secondary | ICD-10-CM | POA: Insufficient documentation

## 2019-01-09 DIAGNOSIS — R1013 Epigastric pain: Secondary | ICD-10-CM | POA: Insufficient documentation

## 2019-01-09 DIAGNOSIS — R197 Diarrhea, unspecified: Secondary | ICD-10-CM | POA: Insufficient documentation

## 2019-01-09 DIAGNOSIS — F1210 Cannabis abuse, uncomplicated: Secondary | ICD-10-CM | POA: Insufficient documentation

## 2019-01-09 HISTORY — DX: Alcohol abuse, uncomplicated: F10.10

## 2019-01-09 LAB — CBC WITH DIFFERENTIAL/PLATELET
Abs Immature Granulocytes: 0.03 10*3/uL (ref 0.00–0.07)
Basophils Absolute: 0.1 10*3/uL (ref 0.0–0.1)
Basophils Relative: 1 %
Eosinophils Absolute: 0.3 10*3/uL (ref 0.0–0.5)
Eosinophils Relative: 4 %
HCT: 44.6 % (ref 39.0–52.0)
Hemoglobin: 14.7 g/dL (ref 13.0–17.0)
Immature Granulocytes: 0 %
Lymphocytes Relative: 34 %
Lymphs Abs: 2.7 10*3/uL (ref 0.7–4.0)
MCH: 29.2 pg (ref 26.0–34.0)
MCHC: 33 g/dL (ref 30.0–36.0)
MCV: 88.7 fL (ref 80.0–100.0)
Monocytes Absolute: 0.9 10*3/uL (ref 0.1–1.0)
Monocytes Relative: 11 %
Neutro Abs: 3.9 10*3/uL (ref 1.7–7.7)
Neutrophils Relative %: 50 %
Platelets: 180 10*3/uL (ref 150–400)
RBC: 5.03 MIL/uL (ref 4.22–5.81)
RDW: 13.7 % (ref 11.5–15.5)
WBC: 7.9 10*3/uL (ref 4.0–10.5)
nRBC: 0 % (ref 0.0–0.2)

## 2019-01-09 LAB — COMPREHENSIVE METABOLIC PANEL
ALT: 20 U/L (ref 0–44)
AST: 17 U/L (ref 15–41)
Albumin: 3.8 g/dL (ref 3.5–5.0)
Alkaline Phosphatase: 133 U/L — ABNORMAL HIGH (ref 38–126)
Anion gap: 8 (ref 5–15)
BUN: 13 mg/dL (ref 6–20)
CO2: 24 mmol/L (ref 22–32)
Calcium: 9 mg/dL (ref 8.9–10.3)
Chloride: 107 mmol/L (ref 98–111)
Creatinine, Ser: 0.84 mg/dL (ref 0.61–1.24)
GFR calc Af Amer: >60 mL/min (ref >60)
GFR calc non Af Amer: >60 mL/min (ref >60)
Glucose, Bld: 107 mg/dL — ABNORMAL HIGH (ref 70–99)
Potassium: 3.6 mmol/L (ref 3.5–5.1)
Sodium: 139 mmol/L (ref 135–145)
Total Bilirubin: 0.3 mg/dL (ref 0.3–1.2)
Total Protein: 6.4 g/dL — ABNORMAL LOW (ref 6.5–8.1)

## 2019-01-09 LAB — URINALYSIS, ROUTINE W REFLEX MICROSCOPIC
Bilirubin Urine: NEGATIVE
Glucose, UA: NEGATIVE mg/dL
Ketones, ur: NEGATIVE mg/dL
Nitrite: NEGATIVE
Protein, ur: NEGATIVE mg/dL
Specific Gravity, Urine: >1.03 — ABNORMAL HIGH (ref 1.005–1.030)
pH: 5.5 (ref 5.0–8.0)

## 2019-01-09 LAB — LIPASE, BLOOD: Lipase: 22 U/L (ref 11–51)

## 2019-01-09 LAB — RAPID URINE DRUG SCREEN, HOSP PERFORMED
Amphetamines: POSITIVE — AB
Barbiturates: NOT DETECTED
Benzodiazepines: NOT DETECTED
Cocaine: NOT DETECTED
Opiates: NOT DETECTED
Tetrahydrocannabinol: POSITIVE — AB

## 2019-01-09 LAB — URINALYSIS, MICROSCOPIC (REFLEX)

## 2019-01-09 MED ORDER — DOXYCYCLINE HYCLATE 100 MG PO CAPS: 100.0000 mg | ORAL_CAPSULE | Freq: Two times a day (BID) | ORAL | 0 refills | Status: DC

## 2019-01-09 MED ORDER — DOXYCYCLINE HYCLATE 100 MG PO TABS
100.0000 mg | ORAL_TABLET | Freq: Once | ORAL | Status: AC
  Administered 2019-01-09: 100 mg via ORAL
  Filled 2019-01-09: qty 1

## 2019-01-09 MED ORDER — PANTOPRAZOLE SODIUM 40 MG PO TBEC
40.0000 mg | DELAYED_RELEASE_TABLET | Freq: Once | ORAL | Status: AC
  Administered 2019-01-09: 40 mg via ORAL
  Filled 2019-01-09: qty 1

## 2019-01-09 MED ORDER — ESOMEPRAZOLE MAGNESIUM 40 MG PO CPDR: 40.0000 mg | DELAYED_RELEASE_CAPSULE | Freq: Every day | ORAL | 0 refills | Status: DC

## 2019-01-09 MED ORDER — CEFTRIAXONE SODIUM 250 MG IJ SOLR
250.0000 mg | Freq: Once | INTRAMUSCULAR | Status: AC
  Administered 2019-01-09: 250 mg via INTRAMUSCULAR
  Filled 2019-01-09: qty 250

## 2019-01-09 MED ORDER — SODIUM CHLORIDE 0.9 % IV BOLUS
1000.0000 mL | Freq: Once | INTRAVENOUS | Status: AC
  Administered 2019-01-09: 1000 mL via INTRAVENOUS

## 2019-01-09 NOTE — ED Provider Notes (Signed)
[facility] EMERGENCY DEPARTMENT Provider Note   CSN: [id number] Arrival date & time: 01/09/19  [id number]     History   Chief Complaint Chief Complaint  Patient presents with  . Abdominal Pain    HPI William HerterClifford Miranda is a 33 y.o. male hx of ETOH abuse, here presenting with abdominal pain, loose stools, penile discharge .  Patient states that he has been using ecstasy a lot has been drinking alcohol.  He is also been sexually active and apparently has been having penile discharge for the last several months.  He also has some loose stools and intermittent rectal bleeding for several months as well.  He has some epigastric pain as well.  Patient denies any vomiting or fevers .  He states that he was seen here before and apparently had multiple negative STD tests.  However he wants a repeat STD test.  Upon chart review, 1 of his STD test was positive for gonorrhea.      The history is provided by the patient.    Past Medical History:  Diagnosis Date  . Bronchitis   . ETOH abuse     There are no active problems to display for this patient.   Past Surgical History:  Procedure Laterality Date  . HAND SURGERY          Home Medications    Prior to Admission medications   Medication Sig Start Date End Date Taking? Authorizing Provider  cephALEXin (KEFLEX) 500 MG capsule Take 1 capsule (500 mg total) by mouth 2 (two) times daily. 10/05/18   Molpus, John, MD  doxycycline (VIBRAMYCIN) 100 MG capsule Take 1 capsule (100 mg total) by mouth 2 (two) times daily. 10/13/18   Horton, Mayer Maskerourtney F, MD    Family History No family history on file.  Social History Social History   Tobacco Use  . Smoking status: Current Every Day Smoker    Types: Cigars  . Smokeless tobacco: Former Engineer, waterUser  Substance Use Topics  . Alcohol use: Yes    Comment: 1/2 gallon   . Drug use: Yes    Comment: "x" pills (ecstasy "every day")     Allergies   Patient has no known allergies.   Review of  Systems Review of Systems  Gastrointestinal: Positive for abdominal pain.  Genitourinary: Positive for discharge.  All other systems reviewed and are negative.    Physical Exam Updated Vital Signs BP 131/82 (BP Location: Left Arm)   Pulse 82   Temp 98.1 F (36.7 C) (Oral)   Resp 18   Ht 5\' 11"  (1.803 m)   Wt 85.7 kg   SpO2 100%   BMI 26.36 kg/m   Physical Exam Vitals signs and nursing note reviewed.  Constitutional:      Appearance: He is well-developed.  HENT:     Head: Normocephalic.     Mouth/Throat:     Mouth: Mucous membranes are moist.  Eyes:     Extraocular Movements: Extraocular movements intact.  Cardiovascular:     Rate and Rhythm: Normal rate and regular rhythm.     Heart sounds: Normal heart sounds.  Pulmonary:     Effort: Pulmonary effort is normal.     Breath sounds: Normal breath sounds.  Abdominal:     General: Abdomen is flat. Bowel sounds are normal.     Palpations: Abdomen is soft.     Comments: Mild epigastric tenderness   Skin:    General: Skin is warm.     Capillary  Refill: Capillary refill takes less than 2 seconds.  Neurological:     General: No focal deficit present.     Mental Status: He is alert and oriented to person, place, and time.  Psychiatric:        Mood and Affect: Mood normal.      ED Treatments / Results  Labs (all labs ordered are listed, but only abnormal results are displayed) Labs Reviewed  URINALYSIS, ROUTINE W REFLEX MICROSCOPIC - Abnormal; Notable for the following components:      Result Value   APPearance CLOUDY (*)    Specific Gravity, Urine >1.030 (*)    Hgb urine dipstick TRACE (*)    Leukocytes,Ua SMALL (*)    All other components within normal limits  COMPREHENSIVE METABOLIC PANEL - Abnormal; Notable for the following components:   Glucose, Bld 107 (*)    Total Protein 6.4 (*)    Alkaline Phosphatase 133 (*)    All other components within normal limits  URINALYSIS, MICROSCOPIC (REFLEX) - Abnormal;  Notable for the following components:   Bacteria, UA RARE (*)    All other components within normal limits  RAPID URINE DRUG SCREEN, HOSP PERFORMED - Abnormal; Notable for the following components:   Amphetamines POSITIVE (*)    Tetrahydrocannabinol POSITIVE (*)    All other components within normal limits  CBC WITH DIFFERENTIAL/PLATELET  LIPASE, BLOOD  HIV ANTIBODY (ROUTINE TESTING W REFLEX)  RPR  ETHANOL  GC/CHLAMYDIA PROBE AMP ([place]) NOT AT [facility]    EKG None  Radiology No results found.  Procedures Procedures (including critical care time)  Medications Ordered in ED Medications  cefTRIAXone (ROCEPHIN) injection 250 mg (250 mg Intramuscular Given 01/09/19 2032)  doxycycline (VIBRA-TABS) tablet 100 mg (100 mg Oral Given 01/09/19 2030)  sodium chloride 0.9 % bolus 1,000 mL (1,000 mLs Intravenous New Bag/Given 01/09/19 2032)  pantoprazole (PROTONIX) EC tablet 40 mg (40 mg Oral Given 01/09/19 2030)     Initial Impression / Assessment and Plan / ED Course  I have reviewed the triage vital signs and the nursing notes.  Pertinent labs & imaging results that were available during my care of the patient were reviewed by me and considered in my medical decision making (see chart for details).       William Miranda is a 33 y.o. male here with abdominal pain, penile discharge, loose stools, blood in stool for several months.  His vitals are stable and he is well-appearing .  He does admit to drinking some alcohol and doing ecstasy. Will get labs, UA, UDS. Doesn't appear intoxicated   9:14 PM Labs unremarkable. AST/ALT normal. UDS + marijuana and amphetamines. Hg stable. Likely alcohol gastritis. Will put on PPI, refer to GI. Told him to stop using drugs and alcohol. UA ? UTI vs STD. Given rocephin. Will dc home with doxycycline to cover UTI/chlamydia    Final Clinical Impressions(s) / ED Diagnoses   Final diagnoses:  None    ED Discharge Orders    None       Drenda Freeze, MD 01/09/19 2115

## 2019-01-09 NOTE — ED Notes (Signed)
IV attempt unsuccessful- another RN to attempt.

## 2019-01-09 NOTE — Discharge Instructions (Signed)
Stop using drugs and alcohol   Take nexium daily   Stay hydrated   Take doxycycline for STD and UTI   We sent off GC/chlamydia   See your doctor   See GI for follow up   Return to ER if you have worse abdominal pain, vomiting, fever

## 2019-01-09 NOTE — ED Triage Notes (Addendum)
Pt c/o diffuse abd pain/ rectal bleeding  and penis discharge x 3 months , pt admitts to etoh

## 2019-01-09 NOTE — ED Notes (Signed)
ED Provider at bedside. 

## 2019-01-11 LAB — RPR: RPR Ser Ql: NONREACTIVE

## 2019-01-13 LAB — GC/CHLAMYDIA PROBE AMP (~~LOC~~) NOT AT ARMC
Chlamydia: NEGATIVE
Neisseria Gonorrhea: NEGATIVE

## 2019-01-14 LAB — HIV ANTIBODY (ROUTINE TESTING W REFLEX): HIV Screen 4th Generation wRfx: NONREACTIVE

## 2019-03-17 ENCOUNTER — Other Ambulatory Visit: Payer: Self-pay

## 2019-03-17 ENCOUNTER — Encounter (HOSPITAL_COMMUNITY): Payer: Self-pay | Admitting: *Deleted

## 2019-03-17 ENCOUNTER — Emergency Department (HOSPITAL_COMMUNITY)
Admission: EM | Admit: 2019-03-17 | Discharge: 2019-03-17 | Disposition: A | Discharge status: Home / Self Care | Payer: Self-pay | Attending: Emergency Medicine | Admitting: Emergency Medicine

## 2019-03-17 DIAGNOSIS — R109 Unspecified abdominal pain: Secondary | ICD-10-CM | POA: Insufficient documentation

## 2019-03-17 DIAGNOSIS — Z5321 Procedure and treatment not carried out due to patient leaving prior to being seen by health care provider: Secondary | ICD-10-CM | POA: Insufficient documentation

## 2019-03-17 DIAGNOSIS — R197 Diarrhea, unspecified: Secondary | ICD-10-CM | POA: Insufficient documentation

## 2019-03-17 HISTORY — DX: Other psychoactive substance abuse, uncomplicated: F19.10

## 2019-03-17 LAB — CBC
HCT: 51.4 % (ref 39.0–52.0)
Hemoglobin: 17.1 g/dL — ABNORMAL HIGH (ref 13.0–17.0)
MCH: 29.9 pg (ref 26.0–34.0)
MCHC: 33.3 g/dL (ref 30.0–36.0)
MCV: 89.9 fL (ref 80.0–100.0)
Platelets: 215 10*3/uL (ref 150–400)
RBC: 5.72 MIL/uL (ref 4.22–5.81)
RDW: 13.2 % (ref 11.5–15.5)
WBC: 9.1 10*3/uL (ref 4.0–10.5)
nRBC: 0 % (ref 0.0–0.2)

## 2019-03-17 LAB — LIPASE, BLOOD: Lipase: 21 U/L (ref 11–51)

## 2019-03-17 LAB — COMPREHENSIVE METABOLIC PANEL
ALT: 22 U/L (ref 0–44)
AST: 18 U/L (ref 15–41)
Albumin: 4.2 g/dL (ref 3.5–5.0)
Alkaline Phosphatase: 130 U/L — ABNORMAL HIGH (ref 38–126)
Anion gap: 8 (ref 5–15)
BUN: 16 mg/dL (ref 6–20)
CO2: 25 mmol/L (ref 22–32)
Calcium: 9 mg/dL (ref 8.9–10.3)
Chloride: 103 mmol/L (ref 98–111)
Creatinine, Ser: 1.1 mg/dL (ref 0.61–1.24)
GFR calc Af Amer: >60 mL/min (ref >60)
GFR calc non Af Amer: >60 mL/min (ref >60)
Glucose, Bld: 103 mg/dL — ABNORMAL HIGH (ref 70–99)
Potassium: 4.3 mmol/L (ref 3.5–5.1)
Sodium: 136 mmol/L (ref 135–145)
Total Bilirubin: 0.5 mg/dL (ref 0.3–1.2)
Total Protein: 7.7 g/dL (ref 6.5–8.1)

## 2019-03-17 MED ORDER — SODIUM CHLORIDE 0.9% FLUSH: 3.0000 mL | Freq: Once | INTRAVENOUS | Status: DC

## 2019-03-17 NOTE — ED Triage Notes (Signed)
Rt side pain under arm to mid ribs for 2 weeks. Diarrhea since last night, "nonstop"Uses Ectasy every day, has rectal pain and difficult controlling bowels. Was treated a week ago for STD

## 2019-03-21 ENCOUNTER — Encounter (HOSPITAL_BASED_OUTPATIENT_CLINIC_OR_DEPARTMENT_OTHER): Payer: Self-pay | Admitting: Emergency Medicine

## 2019-03-21 ENCOUNTER — Emergency Department (HOSPITAL_BASED_OUTPATIENT_CLINIC_OR_DEPARTMENT_OTHER): Payer: Self-pay

## 2019-03-21 ENCOUNTER — Other Ambulatory Visit: Payer: Self-pay

## 2019-03-21 ENCOUNTER — Emergency Department (HOSPITAL_BASED_OUTPATIENT_CLINIC_OR_DEPARTMENT_OTHER)
Admission: EM | Admit: 2019-03-21 | Discharge: 2019-03-21 | Disposition: A | Discharge status: Home / Self Care | Payer: Self-pay | Attending: Emergency Medicine | Admitting: Emergency Medicine

## 2019-03-21 DIAGNOSIS — R197 Diarrhea, unspecified: Secondary | ICD-10-CM

## 2019-03-21 DIAGNOSIS — E876 Hypokalemia: Secondary | ICD-10-CM | POA: Insufficient documentation

## 2019-03-21 DIAGNOSIS — R112 Nausea with vomiting, unspecified: Secondary | ICD-10-CM | POA: Insufficient documentation

## 2019-03-21 DIAGNOSIS — E860 Dehydration: Secondary | ICD-10-CM | POA: Insufficient documentation

## 2019-03-21 DIAGNOSIS — F17290 Nicotine dependence, other tobacco product, uncomplicated: Secondary | ICD-10-CM | POA: Insufficient documentation

## 2019-03-21 LAB — CBC WITH DIFFERENTIAL/PLATELET
Abs Immature Granulocytes: 0.03 10*3/uL (ref 0.00–0.07)
Basophils Absolute: 0.1 10*3/uL (ref 0.0–0.1)
Basophils Relative: 1 %
Eosinophils Absolute: 0.2 10*3/uL (ref 0.0–0.5)
Eosinophils Relative: 2 %
HCT: 47.5 % (ref 39.0–52.0)
Hemoglobin: 16.2 g/dL (ref 13.0–17.0)
Immature Granulocytes: 0 %
Lymphocytes Relative: 24 %
Lymphs Abs: 2.5 10*3/uL (ref 0.7–4.0)
MCH: 29.8 pg (ref 26.0–34.0)
MCHC: 34.1 g/dL (ref 30.0–36.0)
MCV: 87.5 fL (ref 80.0–100.0)
Monocytes Absolute: 1.5 10*3/uL — ABNORMAL HIGH (ref 0.1–1.0)
Monocytes Relative: 15 %
Neutro Abs: 6.1 10*3/uL (ref 1.7–7.7)
Neutrophils Relative %: 58 %
Platelets: 250 10*3/uL (ref 150–400)
RBC: 5.43 MIL/uL (ref 4.22–5.81)
RDW: 12.9 % (ref 11.5–15.5)
WBC Morphology: ABNORMAL
WBC: 10.2 10*3/uL (ref 4.0–10.5)
nRBC: 0 % (ref 0.0–0.2)

## 2019-03-21 LAB — COMPREHENSIVE METABOLIC PANEL
ALT: 24 U/L (ref 0–44)
AST: 20 U/L (ref 15–41)
Albumin: 3.9 g/dL (ref 3.5–5.0)
Alkaline Phosphatase: 130 U/L — ABNORMAL HIGH (ref 38–126)
Anion gap: 9 (ref 5–15)
BUN: 17 mg/dL (ref 6–20)
CO2: 22 mmol/L (ref 22–32)
Calcium: 8.9 mg/dL (ref 8.9–10.3)
Chloride: 104 mmol/L (ref 98–111)
Creatinine, Ser: 1.05 mg/dL (ref 0.61–1.24)
GFR calc Af Amer: >60 mL/min (ref >60)
GFR calc non Af Amer: >60 mL/min (ref >60)
Glucose, Bld: 121 mg/dL — ABNORMAL HIGH (ref 70–99)
Potassium: 3.2 mmol/L — ABNORMAL LOW (ref 3.5–5.1)
Sodium: 135 mmol/L (ref 135–145)
Total Bilirubin: 0.5 mg/dL (ref 0.3–1.2)
Total Protein: 6.8 g/dL (ref 6.5–8.1)

## 2019-03-21 LAB — C DIFFICILE QUICK SCREEN W PCR REFLEX
C Diff antigen: POSITIVE — AB
C Diff toxin: NEGATIVE

## 2019-03-21 LAB — MAGNESIUM: Magnesium: 1.9 mg/dL (ref 1.7–2.4)

## 2019-03-21 LAB — CLOSTRIDIUM DIFFICILE BY PCR, REFLEXED: Toxigenic C. Difficile by PCR: NEGATIVE

## 2019-03-21 LAB — LIPASE, BLOOD: Lipase: 19 U/L (ref 11–51)

## 2019-03-21 MED ORDER — SODIUM CHLORIDE 0.9 % IV BOLUS
1000.0000 mL | Freq: Once | INTRAVENOUS | Status: AC
  Administered 2019-03-21: 1000 mL via INTRAVENOUS

## 2019-03-21 MED ORDER — POTASSIUM CHLORIDE CRYS ER 20 MEQ PO TBCR: 40.0000 meq | EXTENDED_RELEASE_TABLET | Freq: Every day | ORAL | 0 refills | Status: DC

## 2019-03-21 MED ORDER — ONDANSETRON 4 MG PO TBDP: 4.0000 mg | ORAL_TABLET | Freq: Three times a day (TID) | ORAL | 0 refills | Status: DC | PRN

## 2019-03-21 MED ORDER — DICYCLOMINE HCL 20 MG PO TABS: 20.0000 mg | ORAL_TABLET | Freq: Four times a day (QID) | ORAL | 0 refills | Status: DC | PRN

## 2019-03-21 MED ORDER — ONDANSETRON HCL 4 MG/2ML IJ SOLN
4.0000 mg | Freq: Once | INTRAMUSCULAR | Status: AC
  Administered 2019-03-21: 4 mg via INTRAVENOUS
  Filled 2019-03-21: qty 2

## 2019-03-21 NOTE — ED Provider Notes (Signed)
Emergency Department Provider Note   I have reviewed the triage vital signs and the nursing notes.   HISTORY  Chief Complaint Diarrhea and Emesis   HPI William Miranda is a 33 y.o. male with PMH of EtOH and Polysubstance abuse presents to the emergency department with profuse, watery diarrhea, vomiting, abdominal discomfort and right-sided chest pain over the past 5 days.  Patient states he has been using some ecstasy and drinking alcohol.  He reports some nasal congestion as well.  No fevers or shaking chills.  Denies shortness of breath but does have some mild cough.  He has not seen any blood in the bowel movements.  He was treated with antibiotic for an STD approximately 1 week prior to symptom onset but cannot recall the name of the antibiotic. No radiation of symptoms or modifying factors.    Past Medical History:  Diagnosis Date  . Bronchitis   . ETOH abuse   . Polysubstance abuse ([place])     There are no active problems to display for this patient.   Past Surgical History:  Procedure Laterality Date  . HAND SURGERY      Allergies Patient has no known allergies.  No family history on file.  Social History Social History   Tobacco Use  . Smoking status: Current Every Day Smoker    Types: Cigars  . Smokeless tobacco: Former Network engineer Use Topics  . Alcohol use: Yes    Comment: 1/2 gallon   . Drug use: Yes    Comment: "x" pills (ecstasy "every day")    Review of Systems  Constitutional: No fever/chills. Positive weakness.  Eyes: No visual changes. ENT: No sore throat. Cardiovascular: Denies chest pain. Respiratory: Denies shortness of breath. Gastrointestinal: Positive cramping abdominal pain. Positive nausea, vomiting, and diarrhea.  No constipation. Genitourinary: Negative for dysuria. Musculoskeletal: Negative for back pain. Skin: Negative for rash. Neurological: Negative for headaches, focal weakness or numbness.  10-point ROS otherwise  negative.  ____________________________________________   PHYSICAL EXAM:  VITAL SIGNS: ED Triage Vitals  Enc Vitals Group     BP 03/21/19 1752 (!) 111/93     Pulse Rate 03/21/19 1752 (!) 116     Resp 03/21/19 1752 18     Temp 03/21/19 1752 98.4 F (36.9 C)     Temp Source 03/21/19 1752 Oral     SpO2 03/21/19 1752 99 %     Weight 03/21/19 1753 182 lb 15.7 oz (83 kg)     Height 03/21/19 1752 5\' 11"  (1.803 m)   Constitutional: Alert and oriented. Well appearing and in no acute distress. Eyes: Conjunctivae are normal. Head: Atraumatic. Nose: No congestion/rhinnorhea. Mouth/Throat: Mucous membranes are moist.   Neck: No stridor.   Cardiovascular: Tachycardia. Good peripheral circulation. Grossly normal heart sounds.   Respiratory: Normal respiratory effort.  No retractions. Lungs CTAB. Gastrointestinal: Soft with mild epigastric tenderness. No focal tenderness, rebound, or guarding. No distention.  Musculoskeletal: No gross deformities of extremities. Neurologic:  Normal speech and language.  Skin:  Skin is warm, dry and intact. No rash noted.  ____________________________________________   LABS (all labs ordered are listed, but only abnormal results are displayed)  Labs Reviewed  C DIFFICILE QUICK SCREEN W PCR REFLEX - Abnormal; Notable for the following components:      Result Value   C Diff antigen POSITIVE (*)    All other components within normal limits  COMPREHENSIVE METABOLIC PANEL - Abnormal; Notable for the following components:   Potassium 3.2 (*)  Glucose, Bld 121 (*)    Alkaline Phosphatase 130 (*)    All other components within normal limits  CBC WITH DIFFERENTIAL/PLATELET - Abnormal; Notable for the following components:   Monocytes Absolute 1.5 (*)    All other components within normal limits  CLOSTRIDIUM DIFFICILE BY PCR, REFLEXED  GI PATHOGEN PANEL BY PCR, STOOL  LIPASE, BLOOD  MAGNESIUM   ____________________________________________   RADIOLOGY  Dg Chest Portable 1 View  Result Date: 03/21/2019 CLINICAL DATA:  Cough EXAM: PORTABLE CHEST 1 VIEW COMPARISON:  07/28/2018 FINDINGS: The heart size and mediastinal contours are within normal limits. Both lungs are clear. The visualized skeletal structures are unremarkable. IMPRESSION: No acute abnormality of the lungs. Electronically Signed   By: Lauralyn Primes M.D.   On: 03/21/2019 19:22    ____________________________________________   PROCEDURES  Procedure(s) performed:   Procedures  None ____________________________________________   INITIAL IMPRESSION / ASSESSMENT AND PLAN / ED COURSE  Pertinent labs & imaging results that were available during my care of the patient were reviewed by me and considered in my medical decision making (see chart for details).   Patient presents to the emergency department with diffuse abdominal discomfort, nausea, vomiting, diarrhea for the past 5 days.  He has tachycardia.  He has been drinking alcohol and using ecstasy which may be contributing.  Patient was treated for STD with antibiotic within the last week.  Will send a C. difficile and stool studies if patient able to provide sample here.  No focal tenderness on exam to necessitate emergency CT.   C. Diff negative. Labs with mild hypokalemia. Plan for symptom mgmt at home and f/u on stool studies as outpatient.  ____________________________________________  FINAL CLINICAL IMPRESSION(S) / ED DIAGNOSES  Final diagnoses:  Nausea vomiting and diarrhea  Dehydration  Hypokalemia     MEDICATIONS GIVEN DURING THIS VISIT:  Medications  sodium chloride 0.9 % bolus 1,000 mL (0 mLs Intravenous Stopped 03/21/19 2314)  ondansetron (ZOFRAN) injection 4 mg (4 mg Intravenous Given 03/21/19 1901)  sodium chloride 0.9 % bolus 1,000 mL (0 mLs Intravenous Stopped 03/21/19 2314)     NEW OUTPATIENT MEDICATIONS STARTED DURING THIS VISIT:  Discharge Medication List as of 03/21/2019 11:01  PM    START taking these medications   Details  dicyclomine (BENTYL) 20 MG tablet Take 1 tablet (20 mg total) by mouth every 6 (six) hours as needed for spasms (abdominal cramping)., Starting Sat 03/21/2019, Print    ondansetron (ZOFRAN ODT) 4 MG disintegrating tablet Take 1 tablet (4 mg total) by mouth every 8 (eight) hours as needed for nausea or vomiting., Starting Sat 03/21/2019, Print    potassium chloride SA (KLOR-CON) 20 MEQ tablet Take 2 tablets (40 mEq total) by mouth daily for 5 days., Starting Sat 03/21/2019, Until Thu 03/26/2019, Print        Note:  This document was prepared using Dragon voice recognition software and may include unintentional dictation errors.  Alona Bene, MD, [facility] Emergency Medicine    , Arlyss Repress, MD 03/22/19 [phone]

## 2019-03-21 NOTE — Discharge Instructions (Signed)
You were seen in the emergency department today with diarrheal illness.  This is contagious and you need to wash your hands and clean surfaces in your home with bleach-containing solution.  Keep distance from your newborn child.  I have sent your sample off for testing to try and identify the exact cause for your diarrhea.  His test results will come back in the next 12 to 24 hours.  Take the Zofran as needed for nausea/vomiting, Bentyl as needed for abdominal cramping, potassium daily as instructed.

## 2019-03-21 NOTE — ED Triage Notes (Signed)
Diarrhea x 5 days, vomiting today, endorses R rib pain. Typically uses ectasy daily and drinks "a lot of alcohol" daily but states has not used either in several days.

## 2019-03-25 LAB — GI PATHOGEN PANEL BY PCR, STOOL
Adenovirus F 40/41: NOT DETECTED
Astrovirus: NOT DETECTED
Campylobacter by PCR: NOT DETECTED
Cryptosporidium by PCR: NOT DETECTED
Cyclospora cayetanensis: NOT DETECTED
E coli (ETEC) LT/ST: NOT DETECTED
E coli (STEC): NOT DETECTED
Entamoeba histolytica: NOT DETECTED
Enteroaggregative E coli: NOT DETECTED
Enteropathogenic E coli: NOT DETECTED
G lamblia by PCR: DETECTED — AB
Norovirus GI/GII: NOT DETECTED
Plesiomonas shigelloides: NOT DETECTED
Rotavirus A by PCR: NOT DETECTED
Salmonella by PCR: NOT DETECTED
Sapovirus: NOT DETECTED
Shigella by PCR: NOT DETECTED
Vibrio cholerae: NOT DETECTED
Vibrio: NOT DETECTED
Yersinia enterocolitica: NOT DETECTED

## 2019-06-08 IMAGING — DX CHEST - 2 VIEW
2 series · 2 of 2 positions shown · non-contrast
Comparison: None.

CLINICAL DATA: Chest pain.

EXAM:
CHEST - 2 VIEW

[chest pa]
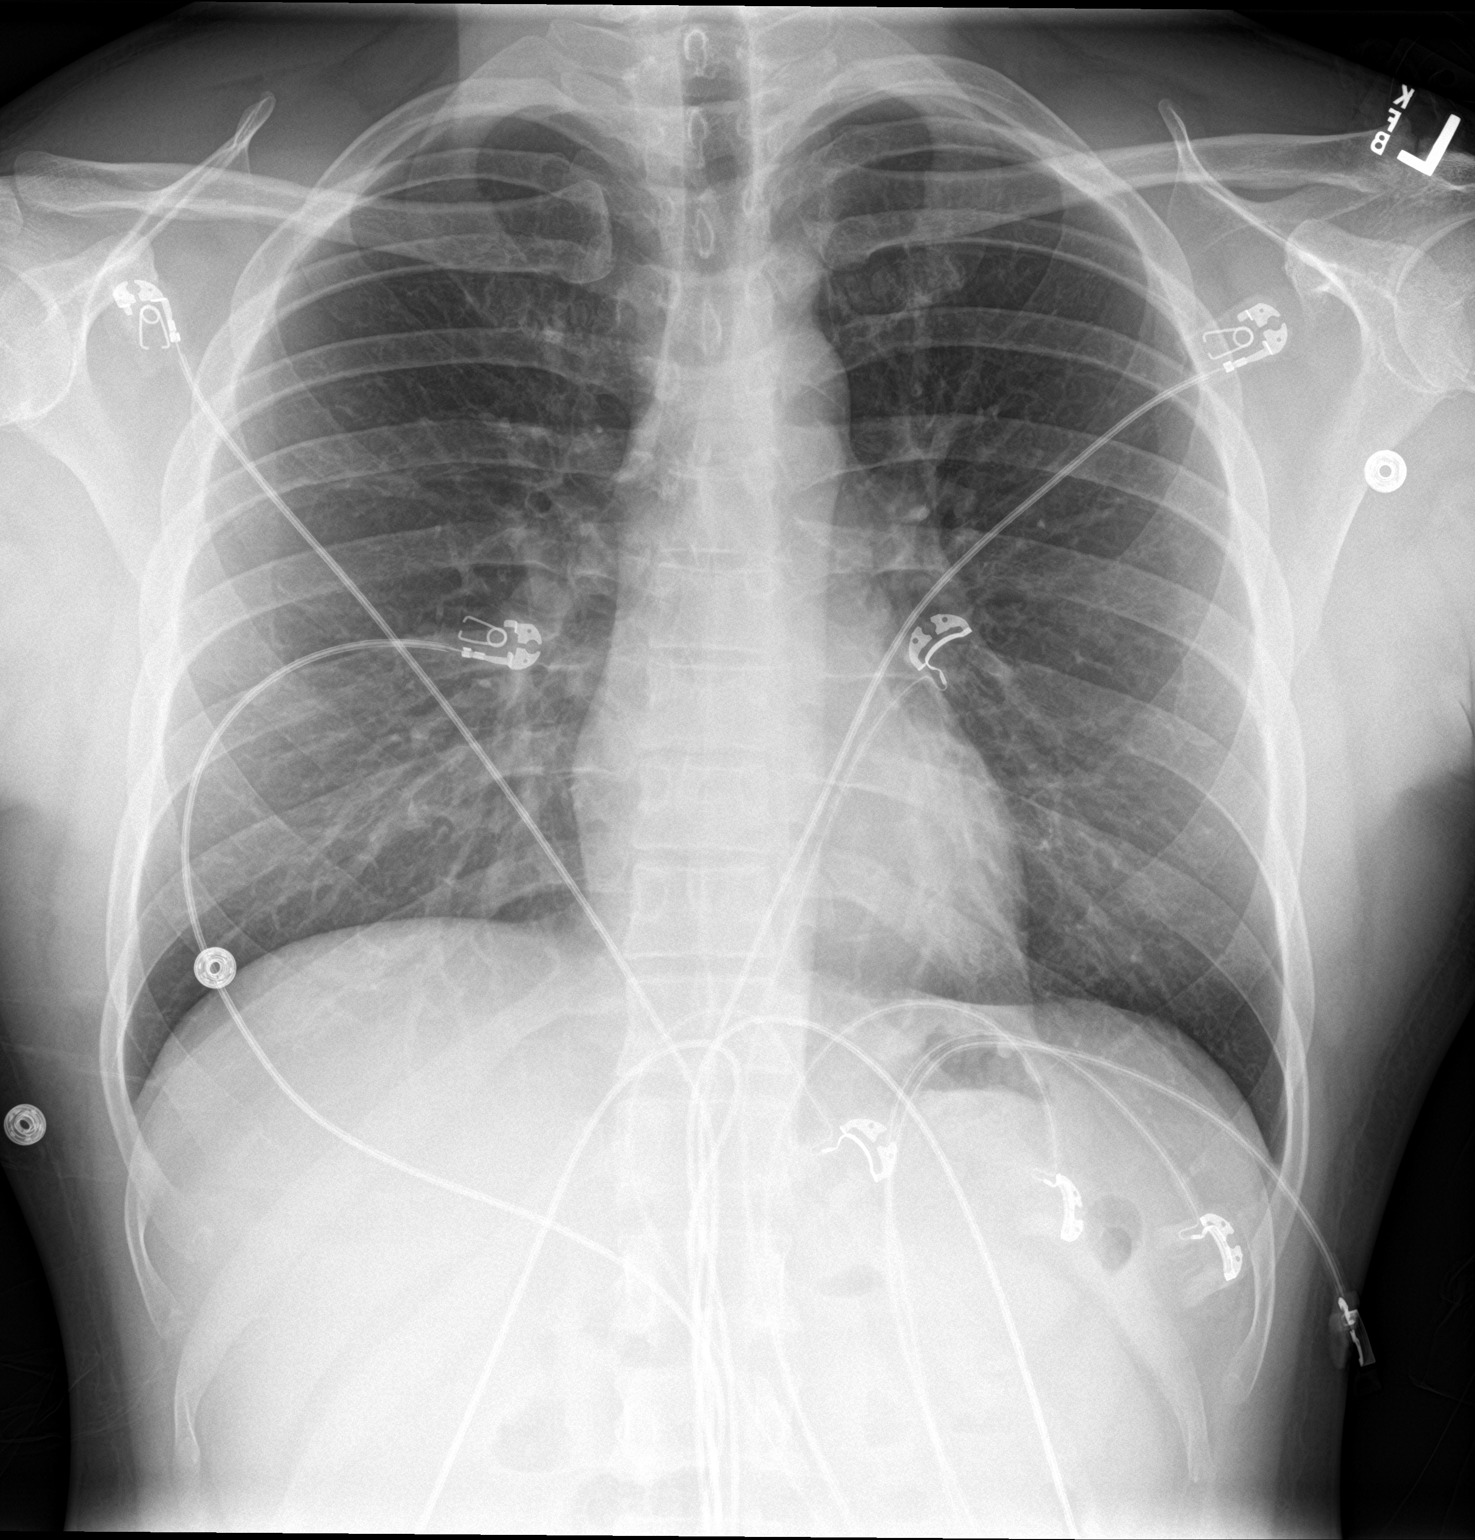

[chest lat]
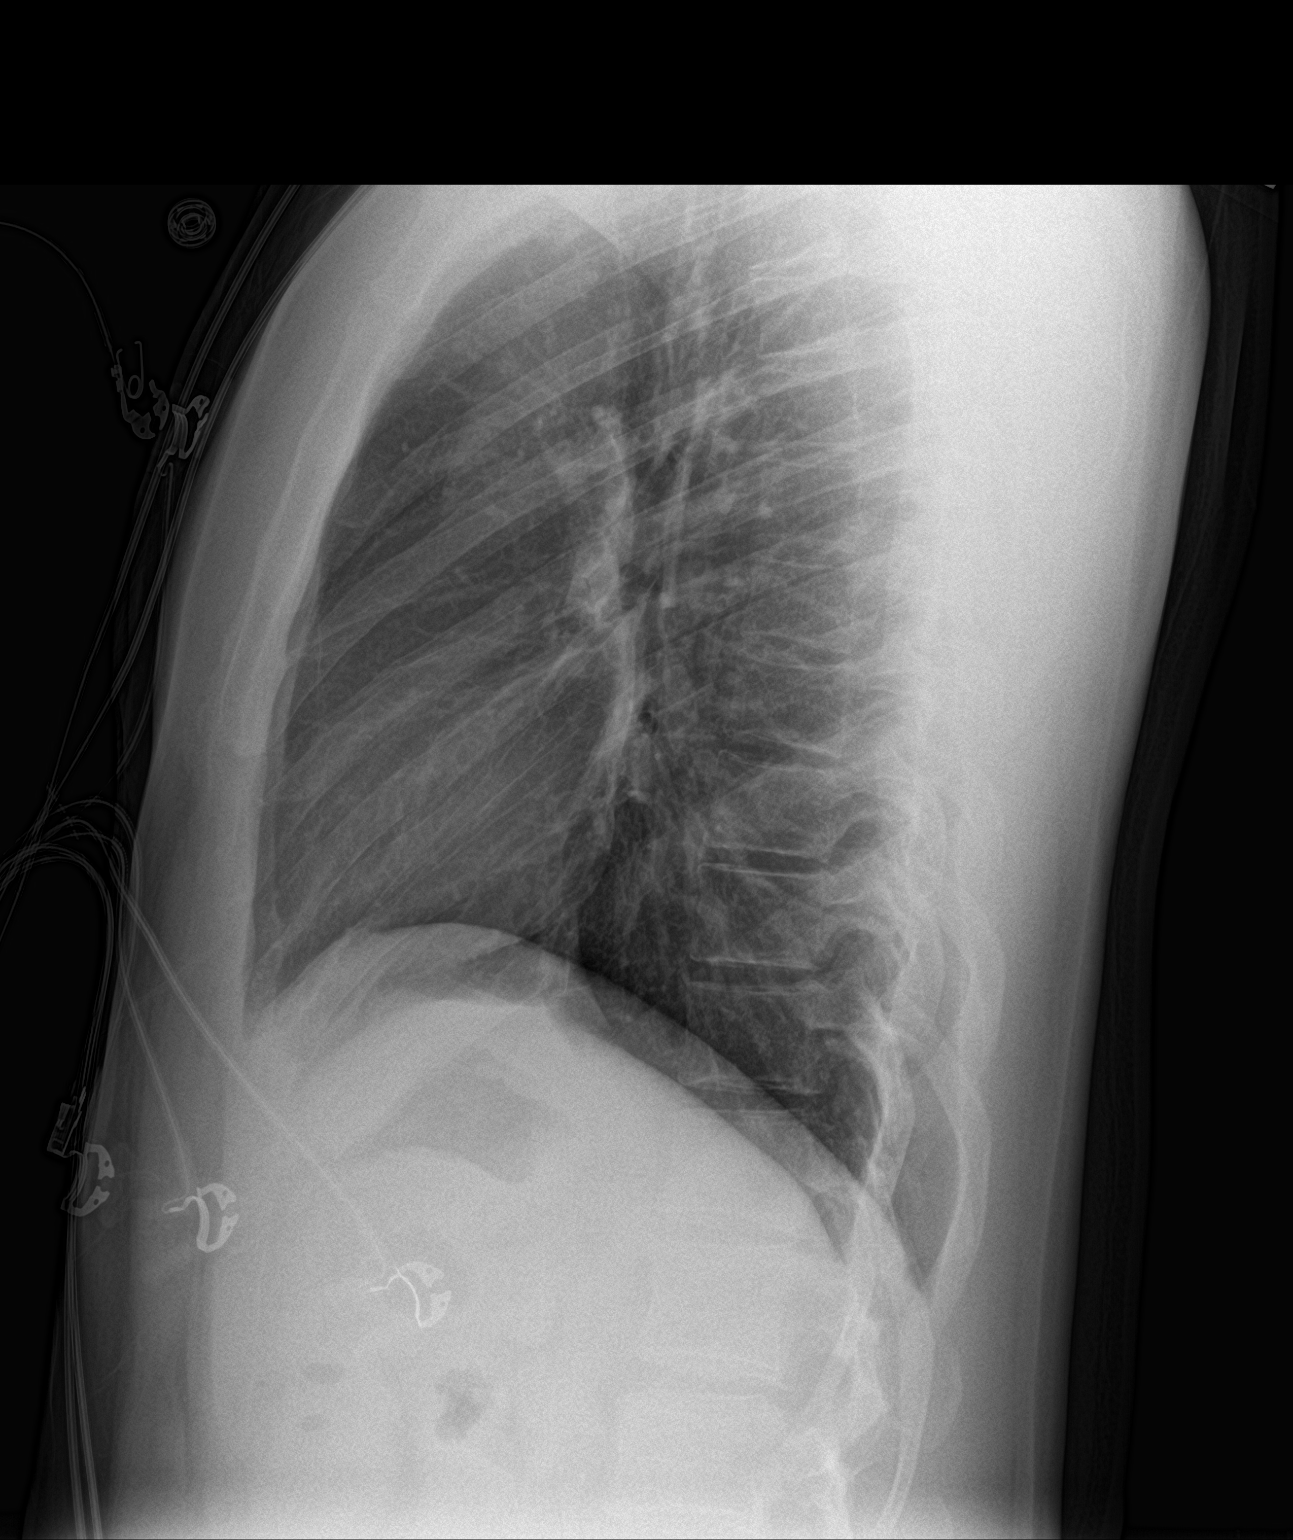

[2 of 2 positions shown; findings below may reference images not displayed]

FINDINGS: The heart size and mediastinal contours are within normal limits.
Both lungs are clear. The visualized skeletal structures are
unremarkable.
IMPRESSION: Negative.  No active cardiopulmonary disease.

## 2019-06-11 ENCOUNTER — Other Ambulatory Visit: Payer: Self-pay

## 2019-06-11 ENCOUNTER — Encounter (HOSPITAL_COMMUNITY): Payer: Self-pay | Admitting: Emergency Medicine

## 2019-06-11 ENCOUNTER — Emergency Department (HOSPITAL_COMMUNITY)
Admission: EM | Admit: 2019-06-11 | Discharge: 2019-06-11 | Disposition: A | Discharge status: Home / Self Care | Payer: Self-pay | Attending: Emergency Medicine | Admitting: Emergency Medicine

## 2019-06-11 DIAGNOSIS — R369 Urethral discharge, unspecified: Secondary | ICD-10-CM | POA: Insufficient documentation

## 2019-06-11 DIAGNOSIS — N342 Other urethritis: Secondary | ICD-10-CM | POA: Insufficient documentation

## 2019-06-11 DIAGNOSIS — F17210 Nicotine dependence, cigarettes, uncomplicated: Secondary | ICD-10-CM | POA: Insufficient documentation

## 2019-06-11 DIAGNOSIS — Z202 Contact with and (suspected) exposure to infections with a predominantly sexual mode of transmission: Secondary | ICD-10-CM | POA: Insufficient documentation

## 2019-06-11 DIAGNOSIS — Z79899 Other long term (current) drug therapy: Secondary | ICD-10-CM | POA: Insufficient documentation

## 2019-06-11 LAB — URINALYSIS, ROUTINE W REFLEX MICROSCOPIC
Bacteria, UA: NONE SEEN
Bilirubin Urine: NEGATIVE
Glucose, UA: NEGATIVE mg/dL
Hgb urine dipstick: NEGATIVE
Ketones, ur: NEGATIVE mg/dL
Nitrite: NEGATIVE
Protein, ur: NEGATIVE mg/dL
Specific Gravity, Urine: 1.029 (ref 1.005–1.030)
WBC, UA: >50 WBC/hpf — ABNORMAL HIGH (ref 0–5)
pH: 5 (ref 5.0–8.0)

## 2019-06-11 LAB — HIV ANTIBODY (ROUTINE TESTING W REFLEX): HIV Screen 4th Generation wRfx: NONREACTIVE

## 2019-06-11 LAB — RPR: RPR Ser Ql: NONREACTIVE

## 2019-06-11 MED ORDER — CEFTRIAXONE SODIUM 500 MG IJ SOLR
250.0000 mg | Freq: Once | INTRAMUSCULAR | Status: AC
  Administered 2019-06-11: 250 mg via INTRAMUSCULAR
  Filled 2019-06-11: qty 500

## 2019-06-11 MED ORDER — LIDOCAINE HCL (PF) 1 % IJ SOLN
INTRAMUSCULAR | Status: AC
  Filled 2019-06-11: qty 5

## 2019-06-11 MED ORDER — AZITHROMYCIN 250 MG PO TABS
1000.0000 mg | ORAL_TABLET | Freq: Once | ORAL | Status: AC
  Administered 2019-06-11: 06:00:00 1000 mg via ORAL
  Filled 2019-06-11: qty 4

## 2019-06-11 NOTE — Discharge Instructions (Addendum)
You have been diagnosed today with possible STD exposure, penile discharge, urethritis.  At this time there does not appear to be the presence of an emergent medical condition, however there is always the potential for conditions to change. Please read and follow the below instructions.  Please return to the Emergency Department immediately for any new or worsening symptoms. Please be sure to follow up with your Primary Care Provider within one week regarding your visit today; please call their office to schedule an appointment even if you are feeling better for a follow-up visit. You have been treated presumptively today for gonorrhea and chlamydia. You have been tested today for gonorrhea and chlamydia as well as HIV and syphilis. These results will be available in approximately 3 days. You may check your MyChart account for results. Please inform all sexual partners of positive results and that they should be tested and treated as well. Please wait 2 weeks and be sure that you and your partners are symptom free before returning to sexual activity. Please use protection with every sexual encounter. Follow Up: Please followup with your primary doctor in 3 days for discussion of your diagnoses and further evaluation after today's visit; if you do not have a primary care doctor use the resource guide provided to find one; Please return to the ER for worsening symptoms, high fevers or persistent vomiting.  Get help right away if: You have severe pain in the belly, back, or side. You vomit repeatedly. You have fever or chills You have testicular pain or swelling You have any new/concerning or worsening of symptoms  Please read the additional information packets attached to your discharge summary.  Do not take your medicine if  develop an itchy rash, swelling in your mouth or lips, or difficulty breathing; call 911 and seek immediate emergency medical attention if this occurs.  Note: Portions of this  text may have been transcribed using voice recognition software. Every effort was made to ensure accuracy; however, inadvertent computerized transcription errors may still be present.

## 2019-06-11 NOTE — ED Provider Notes (Addendum)
[facility]Y [facility]MENT Provider Note   CSN: [id number] Arrival date & time: 06/11/19  0418     History Chief Complaint  Patient presents with  . SEXUALLY TRANSMITTED DISEASE    William Miranda is a 34 y.o. male presents today for penile discharge x5 days.  Reports unprotected sex with male partner over the past several weeks.  He describes a thin white penile discharge ongoing for 5 days, associated with dysuria x5 days only when he urinates.  Dysuria mild burning at the tip of his urethra, nonradiating, pain only with urination resolves when stopping urination.  He denies any fever/chills, abdominal pain, nausea/vomiting, rash/lesion, testicular swelling/pain or any additional concerns.  HPI     Past Medical History:  Diagnosis Date  . Bronchitis   . ETOH abuse   . Polysubstance abuse ([place])     There are no problems to display for this patient.   Past Surgical History:  Procedure Laterality Date  . HAND SURGERY         No family history on file.  Social History   Tobacco Use  . Smoking status: Current Every Day Smoker    Types: Cigars  . Smokeless tobacco: Former Network engineer Use Topics  . Alcohol use: Yes    Comment: 1/2 gallon   . Drug use: Yes    Comment: "x" pills (ecstasy "every day")    Home Medications Prior to Admission medications   Medication Sig Start Date End Date Taking? Authorizing Provider  dicyclomine (BENTYL) 20 MG tablet Take 1 tablet (20 mg total) by mouth every 6 (six) hours as needed for spasms (abdominal cramping). 03/21/19   Long, Wonda Olds, MD  ondansetron (ZOFRAN ODT) 4 MG disintegrating tablet Take 1 tablet (4 mg total) by mouth every 8 (eight) hours as needed for nausea or vomiting. 03/21/19   Long, Wonda Olds, MD  potassium chloride SA (KLOR-CON) 20 MEQ tablet Take 2 tablets (40 mEq total) by mouth daily for 5 days. 03/21/19 03/26/19  Long, Wonda Olds, MD    Allergies    Patient has no known  allergies.  Review of Systems   Review of Systems Ten systems are reviewed and are negative for acute change except as noted in the HPI Physical Exam Updated Vital Signs BP 123/75   Pulse 81   Temp 98.2 F (36.8 C) (Oral)   Resp 18   Wt 83 kg   SpO2 96%   BMI 25.52 kg/m   Physical Exam Constitutional:      General: He is not in acute distress.    Appearance: Normal appearance. He is well-developed. He is not ill-appearing or diaphoretic.  HENT:     Head: Normocephalic and atraumatic.     Right Ear: External ear normal.     Left Ear: External ear normal.     Nose: Nose normal.  Eyes:     General: Vision grossly intact. Gaze aligned appropriately.     Pupils: Pupils are equal, round, and reactive to light.  Neck:     Trachea: Trachea and phonation normal. No tracheal deviation.  Pulmonary:     Effort: Pulmonary effort is normal. No respiratory distress.  Abdominal:     General: There is no distension.     Palpations: Abdomen is soft.     Tenderness: There is no abdominal tenderness. There is no guarding or rebound.  Genitourinary:    Comments: Chaperone present during genital exam Runner, broadcasting/film/video.  No external genital lesions  noted, no bumps on head of penis, specifically no vesicles concerning for herpes or chancre suggestive of syphilis.  No pain with palpation of the penis/glans, no discharge or urethritis noted.  Scrotum and testicles without erythema/swelling or tenderness to palpation. Cremasteric reflex intact bilaterally. No palpable hernia noted.  Musculoskeletal:        General: Normal range of motion.     Cervical back: Normal range of motion.  Skin:    General: Skin is warm and dry.  Neurological:     Mental Status: He is alert.     GCS: GCS eye subscore is 4. GCS verbal subscore is 5. GCS motor subscore is 6.     Comments: Speech is clear and goal oriented, follows commands Major Cranial nerves without deficit, no facial droop Moves extremities without  ataxia, coordination intact  Psychiatric:        Behavior: Behavior normal.     ED Results / Procedures / Treatments   Labs (all labs ordered are listed, but only abnormal results are displayed) Labs Reviewed  URINALYSIS, ROUTINE W REFLEX MICROSCOPIC - Abnormal; Notable for the following components:      Result Value   APPearance HAZY (*)    Leukocytes,Ua LARGE (*)    WBC, UA >50 (*)    All other components within normal limits  RPR  HIV ANTIBODY (ROUTINE TESTING W REFLEX)  GC/CHLAMYDIA PROBE AMP ([place]) NOT AT [facility]    EKG None  Radiology No results found.  Procedures Procedures (including critical care time)  Medications Ordered in ED Medications  cefTRIAXone (ROCEPHIN) injection 250 mg (has no administration in time range)  azithromycin (ZITHROMAX) tablet 1,000 mg (has no administration in time range)    ED Course  I have reviewed the triage vital signs and the nursing notes.  Pertinent labs & imaging results that were available during my care of the patient were reviewed by me and considered in my medical decision making (see chart for details).    MDM Rules/Calculators/A&P                      34 year old male presents today for 5 days of penile discharge and dysuria.  Urinalysis today shows large leukocytes, greater than 50 WBCs, 6-10 RBCs.  Physical exam within normal limits, no active discharge, no lesions or rashes, no testicular pain/swelling, no hernia.  No evidence of epididymitis, prostatitis or ascending infection.  History and UA concerning for STI.  Patient treated with 200 mg IM Rocephin and 1 g PO azithromycin. Patient advised to inform and treat all sexual partners.  Pt advised on safe sex practices and understands that they have GC/Chlamydia as well as HIV and syphilis tests pending and will result in 2-3 days.  Patient informed to check for his results on his MyChart account in 2-3 days. Patient encouraged to follow up at local health department  for future STI checks. No concern for prostatitis or epididymitis at this time.   At this time there does not appear to be any evidence of an acute emergency medical condition and the patient appears stable for discharge with appropriate outpatient follow up. Diagnosis was discussed with patient who verbalizes understanding of care plan and is agreeable to discharge. I have discussed return precautions with patient who verbalizes understanding of return precautions. Patient encouraged to follow-up with their PCP. All questions answered.  Note: Portions of this report may have been transcribed using voice recognition software. Every effort was made to  ensure accuracy; however, inadvertent computerized transcription errors may still be present. Final Clinical Impression(s) / ED Diagnoses Final diagnoses:  Penile discharge  Urethritis  Possible exposure to STD    Rx / DC Orders ED Discharge Orders    None        Elizabeth Palau 06/11/19 1980    Zadie Rhine, MD 06/11/19 [phone]

## 2019-06-11 NOTE — ED Triage Notes (Signed)
Pt c/o penile pain and discharge, possible exposure to STD's

## 2019-06-12 LAB — GC/CHLAMYDIA PROBE AMP (~~LOC~~) NOT AT ARMC
Chlamydia: NEGATIVE
Neisseria Gonorrhea: NEGATIVE

## 2019-07-09 ENCOUNTER — Ambulatory Visit: Payer: Self-pay | Admitting: Medical

## 2019-07-09 DIAGNOSIS — Z0289 Encounter for other administrative examinations: Secondary | ICD-10-CM

## 2019-09-02 ENCOUNTER — Other Ambulatory Visit: Payer: Self-pay

## 2019-09-02 ENCOUNTER — Emergency Department (HOSPITAL_BASED_OUTPATIENT_CLINIC_OR_DEPARTMENT_OTHER)
Admission: EM | Admit: 2019-09-02 | Discharge: 2019-09-02 | Disposition: A | Discharge status: Home / Self Care | Payer: Self-pay | Attending: Emergency Medicine | Admitting: Emergency Medicine

## 2019-09-02 ENCOUNTER — Encounter (HOSPITAL_BASED_OUTPATIENT_CLINIC_OR_DEPARTMENT_OTHER): Payer: Self-pay | Admitting: *Deleted

## 2019-09-02 DIAGNOSIS — Y9389 Activity, other specified: Secondary | ICD-10-CM | POA: Insufficient documentation

## 2019-09-02 DIAGNOSIS — W268XXA Contact with other sharp object(s), not elsewhere classified, initial encounter: Secondary | ICD-10-CM | POA: Insufficient documentation

## 2019-09-02 DIAGNOSIS — F1610 Hallucinogen abuse, uncomplicated: Secondary | ICD-10-CM | POA: Insufficient documentation

## 2019-09-02 DIAGNOSIS — S61411A Laceration without foreign body of right hand, initial encounter: Secondary | ICD-10-CM | POA: Insufficient documentation

## 2019-09-02 DIAGNOSIS — Y9289 Other specified places as the place of occurrence of the external cause: Secondary | ICD-10-CM | POA: Insufficient documentation

## 2019-09-02 DIAGNOSIS — Z23 Encounter for immunization: Secondary | ICD-10-CM | POA: Insufficient documentation

## 2019-09-02 DIAGNOSIS — F17290 Nicotine dependence, other tobacco product, uncomplicated: Secondary | ICD-10-CM | POA: Insufficient documentation

## 2019-09-02 DIAGNOSIS — F1210 Cannabis abuse, uncomplicated: Secondary | ICD-10-CM | POA: Insufficient documentation

## 2019-09-02 DIAGNOSIS — Y998 Other external cause status: Secondary | ICD-10-CM | POA: Insufficient documentation

## 2019-09-02 MED ORDER — LIDOCAINE-EPINEPHRINE (PF) 2 %-1:200000 IJ SOLN: 10.0000 mL | Freq: Once | INTRAMUSCULAR | Status: DC

## 2019-09-02 MED ORDER — TETANUS-DIPHTH-ACELL PERTUSSIS 5-2.5-18.5 LF-MCG/0.5 IM SUSP
0.5000 mL | Freq: Once | INTRAMUSCULAR | Status: AC
  Administered 2019-09-02: 0.5 mL via INTRAMUSCULAR

## 2019-09-02 MED ORDER — LIDOCAINE HCL 1 % IJ SOLN
INTRAMUSCULAR | Status: AC
  Filled 2019-09-02: qty 20

## 2019-09-02 MED ORDER — TETANUS-DIPHTHERIA TOXOIDS TD 5-2 LFU IM INJ
0.5000 mL | INJECTION | Freq: Once | INTRAMUSCULAR | Status: DC
  Filled 2019-09-02: qty 0.5

## 2019-09-02 NOTE — ED Triage Notes (Signed)
Pt co lac to right hand by metal fence x 2 hrs ago

## 2019-09-02 NOTE — ED Provider Notes (Signed)
[facility] EMERGENCY DEPARTMENT Provider Note   CSN: [id number] Arrival date & time: 09/02/19  2135     History Chief Complaint  Patient presents with  . Extremity Laceration    William Miranda is a 34 y.o. male.  Patient sustained laceration to his right hand from a metal fence when he was trying to jump over it.  This happened around 5 PM.  He has a laceration to the base of his third digit on the palmar surface of his right hand.  He denies any fall or any injury.  No head or neck injury.  There is no focal weakness, numbness or tingling.  States there was bleeding at home but none currently. He is able to move all his fingers.  Unknown last tetanus shot.  Denies any head, neck, back, chest or abdominal pain.  The history is provided by the patient.       Past Medical History:  Diagnosis Date  . Bronchitis   . ETOH abuse   . Polysubstance abuse (HCC)     There are no problems to display for this patient.   Past Surgical History:  Procedure Laterality Date  . HAND SURGERY     right  . HAND SURGERY         History reviewed. No pertinent family history.  Social History   Tobacco Use  . Smoking status: Current Every Day Smoker    Packs/day: 0.50    Types: Cigars  . Smokeless tobacco: Never Used  Substance Use Topics  . Alcohol use: Yes    Comment: 1/2 gallon   . Drug use: Yes    Types: Marijuana    Comment: "x" pills (ecstasy "every day")    Home Medications Prior to Admission medications   Medication Sig Start Date End Date Taking? Authorizing Provider  ibuprofen (ADVIL,MOTRIN) 600 MG tablet Take 1 tablet (600 mg total) by mouth every 6 (six) hours as needed. Patient not taking: Reported on 08/29/2018 06/22/18   Michela Pitcher A, PA-C  potassium chloride SA (KLOR-CON) 20 MEQ tablet Take 2 tablets (40 mEq total) by mouth daily for 5 days. 03/21/19 03/26/19  Long, Arlyss Repress, MD    Allergies    Patient has no known allergies.  Review of  Systems   Review of Systems  Constitutional: Negative for activity change, appetite change and fever.  HENT: Negative for congestion and rhinorrhea.   Respiratory: Negative for cough and shortness of breath.   Cardiovascular: Negative for chest pain.  Gastrointestinal: Negative for abdominal pain, nausea and vomiting.  Genitourinary: Negative for dysuria and hematuria.  Musculoskeletal: Negative for arthralgias.  Skin: Positive for wound.  Neurological: Negative for dizziness, weakness and headaches.   all other systems are negative except as noted in the HPI and PMH.    Physical Exam Updated Vital Signs BP 122/68 (BP Location: Left Arm)   Pulse 72   Temp 98.2 F (36.8 C) (Oral)   Resp 16   SpO2 99%   Physical Exam Vitals and nursing note reviewed.  Constitutional:      General: He is not in acute distress.    Appearance: He is well-developed.  HENT:     Head: Normocephalic and atraumatic.     Mouth/Throat:     Pharynx: No oropharyngeal exudate.  Eyes:     Conjunctiva/sclera: Conjunctivae normal.     Pupils: Pupils are equal, round, and reactive to light.  Neck:     Comments: No meningismus. Cardiovascular:  Rate and Rhythm: Normal rate and regular rhythm.     Heart sounds: Normal heart sounds. No murmur.  Pulmonary:     Effort: Pulmonary effort is normal. No respiratory distress.     Breath sounds: Normal breath sounds.  Abdominal:     Palpations: Abdomen is soft.     Tenderness: There is no abdominal tenderness. There is no guarding or rebound.  Musculoskeletal:        General: No tenderness. Normal range of motion.     Cervical back: Normal range of motion and neck supple.     Comments: 3 cm vertical laceration to the base of the third finger on the palmar surface of the right hand.  Bleeding controlled.  Flexion and extension intact at MCP, DIP and PIP joint on 3rd digit.  Distal sensation intact +2 radial pulse  Skin:    General: Skin is warm.      Capillary Refill: Capillary refill takes less than 2 seconds.  Neurological:     General: No focal deficit present.     Mental Status: He is alert and oriented to person, place, and time. Mental status is at baseline.     Cranial Nerves: No cranial nerve deficit.     Motor: No abnormal muscle tone.     Coordination: Coordination normal.     Comments: No ataxia on finger to nose bilaterally. No pronator drift. 5/5 strength throughout. CN 2-12 intact.Equal grip strength. Sensation intact.   Psychiatric:        Behavior: Behavior normal.     ED Results / Procedures / Treatments   Labs (all labs ordered are listed, but only abnormal results are displayed) Labs Reviewed - No data to display  EKG None  Radiology No results found.  Procedures .Marland KitchenLaceration Repair  Date/Time: 09/02/2019 11:48 PM Performed by: Ezequiel Essex, MD Authorized by: Ezequiel Essex, MD   Consent:    Consent obtained:  Verbal   Consent given by:  Patient   Risks discussed:  Infection, need for additional repair, nerve damage, poor cosmetic result, pain, retained foreign body, tendon damage, vascular damage and poor wound healing   Alternatives discussed:  No treatment Anesthesia (see MAR for exact dosages):    Anesthesia method:  Local infiltration   Local anesthetic:  Lidocaine 1% w/o epi Laceration details:    Location:  Hand   Hand location:  R palm   Length (cm):  3 Repair type:    Repair type:  Simple Pre-procedure details:    Preparation:  Patient was prepped and draped in usual sterile fashion Exploration:    Hemostasis achieved with:  Direct pressure   Wound exploration: wound explored through full range of motion and entire depth of wound probed and visualized     Wound extent: no foreign bodies/material noted and no vascular damage noted     Contaminated: no   Treatment:    Area cleansed with:  Betadine   Amount of cleaning:  Standard   Irrigation solution:  Sterile saline    Irrigation method:  Pressure wash   Visualized foreign bodies/material removed: no   Skin repair:    Repair method:  Sutures   Suture size:  4-0   Suture material:  Nylon   Suture technique:  Simple interrupted   Number of sutures:  4 Approximation:    Approximation:  Close Post-procedure details:    Dressing:  Antibiotic ointment and adhesive bandage   Patient tolerance of procedure:  Tolerated well, no immediate complications   (  including critical care time)  Medications Ordered in ED Medications  lidocaine (XYLOCAINE) 1 % (with pres) injection (has no administration in time range)  Tdap (BOOSTRIX) injection 0.5 mL (0.5 mLs Intramuscular Given 09/02/19 2155)    ED Course  I have reviewed the triage vital signs and the nursing notes.  Pertinent labs & imaging results that were available during my care of the patient were reviewed by me and considered in my medical decision making (see chart for details).    MDM Rules/Calculators/A&P                       Patient with laceration to third finger.  Neurovascularly intact.  Tetanus updated in the ED.  Laceration repaired as above.  Patient given wound care instructions, splint for comfort, follow-up with PCP for suture removal in 7 days. Return precautions discussed.   Final Clinical Impression(s) / ED Diagnoses Final diagnoses:  Laceration of right hand without foreign body, initial encounter    Rx / DC Orders ED Discharge Orders    None       Trygve Thal, Jeannett Senior, MD 09/03/19 0107

## 2019-09-02 NOTE — ED Notes (Signed)
Right hand wound care and finger splint applied with Coban. Tolerated weill

## 2019-09-02 NOTE — Discharge Instructions (Signed)
Keep wound clean and dry.  Follow-up for suture removal in 7 days with your primary doctor or urgent care.  Return to the ED with worsening pain, weakness, numbness, tingling, fever, other concerns.

## 2019-09-02 NOTE — ED Notes (Signed)
Irrigated right hand wound with NS. Pt tolerated well.CMS intact fingers.

## 2019-09-11 ENCOUNTER — Emergency Department (HOSPITAL_BASED_OUTPATIENT_CLINIC_OR_DEPARTMENT_OTHER): Payer: No Typology Code available for payment source

## 2019-09-11 ENCOUNTER — Other Ambulatory Visit: Payer: Self-pay

## 2019-09-11 ENCOUNTER — Encounter (HOSPITAL_BASED_OUTPATIENT_CLINIC_OR_DEPARTMENT_OTHER): Payer: Self-pay | Admitting: Emergency Medicine

## 2019-09-11 ENCOUNTER — Emergency Department (HOSPITAL_BASED_OUTPATIENT_CLINIC_OR_DEPARTMENT_OTHER)
Admission: EM | Admit: 2019-09-11 | Discharge: 2019-09-11 | Disposition: A | Discharge status: Home / Self Care | Payer: No Typology Code available for payment source | Attending: Emergency Medicine | Admitting: Emergency Medicine

## 2019-09-11 DIAGNOSIS — F17210 Nicotine dependence, cigarettes, uncomplicated: Secondary | ICD-10-CM | POA: Diagnosis not present

## 2019-09-11 DIAGNOSIS — Y92410 Unspecified street and highway as the place of occurrence of the external cause: Secondary | ICD-10-CM | POA: Diagnosis not present

## 2019-09-11 DIAGNOSIS — T148XXA Other injury of unspecified body region, initial encounter: Secondary | ICD-10-CM

## 2019-09-11 DIAGNOSIS — Y9389 Activity, other specified: Secondary | ICD-10-CM | POA: Insufficient documentation

## 2019-09-11 DIAGNOSIS — R519 Headache, unspecified: Secondary | ICD-10-CM | POA: Diagnosis not present

## 2019-09-11 DIAGNOSIS — M7918 Myalgia, other site: Secondary | ICD-10-CM | POA: Diagnosis present

## 2019-09-11 DIAGNOSIS — Z79899 Other long term (current) drug therapy: Secondary | ICD-10-CM | POA: Insufficient documentation

## 2019-09-11 DIAGNOSIS — Y999 Unspecified external cause status: Secondary | ICD-10-CM | POA: Diagnosis not present

## 2019-09-11 MED ORDER — IBUPROFEN 400 MG PO TABS
600.0000 mg | ORAL_TABLET | Freq: Once | ORAL | Status: AC
  Administered 2019-09-11: 600 mg via ORAL
  Filled 2019-09-11: qty 1
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
## 2019-09-15 ENCOUNTER — Other Ambulatory Visit: Payer: Self-pay

## 2019-09-15 ENCOUNTER — Emergency Department (HOSPITAL_BASED_OUTPATIENT_CLINIC_OR_DEPARTMENT_OTHER)
Admission: EM | Admit: 2019-09-15 | Discharge: 2019-09-15 | Disposition: A | Discharge status: Home / Self Care | Payer: No Typology Code available for payment source | Attending: Emergency Medicine | Admitting: Emergency Medicine

## 2019-09-15 ENCOUNTER — Encounter (HOSPITAL_BASED_OUTPATIENT_CLINIC_OR_DEPARTMENT_OTHER): Payer: Self-pay | Admitting: Emergency Medicine

## 2019-09-15 DIAGNOSIS — M7918 Myalgia, other site: Secondary | ICD-10-CM | POA: Insufficient documentation

## 2019-09-15 DIAGNOSIS — M545 Low back pain: Secondary | ICD-10-CM | POA: Diagnosis not present

## 2019-09-15 DIAGNOSIS — F17210 Nicotine dependence, cigarettes, uncomplicated: Secondary | ICD-10-CM | POA: Diagnosis not present

## 2019-09-15 DIAGNOSIS — M25512 Pain in left shoulder: Secondary | ICD-10-CM | POA: Diagnosis present

## 2019-09-15 MED ORDER — CYCLOBENZAPRINE HCL 5 MG PO TABS
5.0000 mg | ORAL_TABLET | Freq: Once | ORAL | Status: AC
  Administered 2019-09-15: 5 mg via ORAL
  Filled 2019-09-15: qty 1

## 2019-09-15 MED ORDER — CYCLOBENZAPRINE HCL 5 MG PO TABS: 5.0000 mg | ORAL_TABLET | Freq: Two times a day (BID) | ORAL | 0 refills | Status: DC | PRN

## 2019-09-15 MED ORDER — KETOROLAC TROMETHAMINE 30 MG/ML IJ SOLN
30.0000 mg | Freq: Once | INTRAMUSCULAR | Status: AC
  Administered 2019-09-15: 30 mg via INTRAMUSCULAR
  Filled 2019-09-15: qty 1
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
## 2019-11-06 ENCOUNTER — Encounter (HOSPITAL_BASED_OUTPATIENT_CLINIC_OR_DEPARTMENT_OTHER): Payer: Self-pay | Admitting: Emergency Medicine

## 2019-11-06 ENCOUNTER — Other Ambulatory Visit: Payer: Self-pay

## 2019-11-06 ENCOUNTER — Emergency Department (HOSPITAL_BASED_OUTPATIENT_CLINIC_OR_DEPARTMENT_OTHER)
Admission: EM | Admit: 2019-11-06 | Discharge: 2019-11-06 | Disposition: A | Discharge status: Home / Self Care | Payer: Self-pay | Attending: Emergency Medicine | Admitting: Emergency Medicine

## 2019-11-06 ENCOUNTER — Emergency Department (HOSPITAL_BASED_OUTPATIENT_CLINIC_OR_DEPARTMENT_OTHER): Payer: Self-pay

## 2019-11-06 DIAGNOSIS — N390 Urinary tract infection, site not specified: Secondary | ICD-10-CM | POA: Insufficient documentation

## 2019-11-06 DIAGNOSIS — R369 Urethral discharge, unspecified: Secondary | ICD-10-CM | POA: Insufficient documentation

## 2019-11-06 DIAGNOSIS — Z711 Person with feared health complaint in whom no diagnosis is made: Secondary | ICD-10-CM

## 2019-11-06 DIAGNOSIS — F1210 Cannabis abuse, uncomplicated: Secondary | ICD-10-CM | POA: Insufficient documentation

## 2019-11-06 DIAGNOSIS — N433 Hydrocele, unspecified: Secondary | ICD-10-CM | POA: Insufficient documentation

## 2019-11-06 DIAGNOSIS — R300 Dysuria: Secondary | ICD-10-CM | POA: Insufficient documentation

## 2019-11-06 DIAGNOSIS — F17290 Nicotine dependence, other tobacco product, uncomplicated: Secondary | ICD-10-CM | POA: Insufficient documentation

## 2019-11-06 DIAGNOSIS — B9689 Other specified bacterial agents as the cause of diseases classified elsewhere: Secondary | ICD-10-CM | POA: Insufficient documentation

## 2019-11-06 DIAGNOSIS — M545 Low back pain: Secondary | ICD-10-CM | POA: Insufficient documentation

## 2019-11-06 DIAGNOSIS — N50812 Left testicular pain: Secondary | ICD-10-CM | POA: Insufficient documentation

## 2019-11-06 LAB — BASIC METABOLIC PANEL
Anion gap: 8 (ref 5–15)
BUN: 13 mg/dL (ref 6–20)
CO2: 25 mmol/L (ref 22–32)
Calcium: 9 mg/dL (ref 8.9–10.3)
Chloride: 106 mmol/L (ref 98–111)
Creatinine, Ser: 0.87 mg/dL (ref 0.61–1.24)
GFR calc Af Amer: >60 mL/min (ref >60)
GFR calc non Af Amer: >60 mL/min (ref >60)
Glucose, Bld: 100 mg/dL — ABNORMAL HIGH (ref 70–99)
Potassium: 3.8 mmol/L (ref 3.5–5.1)
Sodium: 139 mmol/L (ref 135–145)

## 2019-11-06 LAB — CBC WITH DIFFERENTIAL/PLATELET
Abs Immature Granulocytes: 0.06 10*3/uL (ref 0.00–0.07)
Basophils Absolute: 0.1 10*3/uL (ref 0.0–0.1)
Basophils Relative: 1 %
Eosinophils Absolute: 0.2 10*3/uL (ref 0.0–0.5)
Eosinophils Relative: 2 %
HCT: 45.2 % (ref 39.0–52.0)
Hemoglobin: 15.2 g/dL (ref 13.0–17.0)
Immature Granulocytes: 1 %
Lymphocytes Relative: 22 %
Lymphs Abs: 2.8 10*3/uL (ref 0.7–4.0)
MCH: 29.9 pg (ref 26.0–34.0)
MCHC: 33.6 g/dL (ref 30.0–36.0)
MCV: 88.8 fL (ref 80.0–100.0)
Monocytes Absolute: 1.4 10*3/uL — ABNORMAL HIGH (ref 0.1–1.0)
Monocytes Relative: 10 %
Neutro Abs: 8.5 10*3/uL — ABNORMAL HIGH (ref 1.7–7.7)
Neutrophils Relative %: 64 %
Platelets: 234 10*3/uL (ref 150–400)
RBC: 5.09 MIL/uL (ref 4.22–5.81)
RDW: 13.1 % (ref 11.5–15.5)
WBC: 13 10*3/uL — ABNORMAL HIGH (ref 4.0–10.5)
nRBC: 0 % (ref 0.0–0.2)

## 2019-11-06 LAB — URINALYSIS, ROUTINE W REFLEX MICROSCOPIC
Bilirubin Urine: NEGATIVE
Glucose, UA: NEGATIVE mg/dL
Ketones, ur: NEGATIVE mg/dL
Nitrite: NEGATIVE
Protein, ur: NEGATIVE mg/dL
Specific Gravity, Urine: 1.025 (ref 1.005–1.030)
pH: 6.5 (ref 5.0–8.0)

## 2019-11-06 LAB — URINALYSIS, MICROSCOPIC (REFLEX): WBC, UA: >50 WBC/hpf (ref 0–5)

## 2019-11-06 MED ORDER — DOXYCYCLINE HYCLATE 100 MG PO CAPS: 100.0000 mg | ORAL_CAPSULE | Freq: Two times a day (BID) | ORAL | 0 refills | Status: AC

## 2019-11-06 MED ORDER — ALBUTEROL SULFATE HFA 108 (90 BASE) MCG/ACT IN AERS
2.0000 | INHALATION_SPRAY | Freq: Once | RESPIRATORY_TRACT | Status: AC
  Administered 2019-11-06: 2 via RESPIRATORY_TRACT
  Filled 2019-11-06: qty 6.7

## 2019-11-06 MED ORDER — CEFDINIR 300 MG PO CAPS: 300.0000 mg | ORAL_CAPSULE | Freq: Two times a day (BID) | ORAL | 0 refills | Status: AC

## 2019-11-06 MED ORDER — CEFTRIAXONE SODIUM 500 MG IJ SOLR
500.0000 mg | Freq: Once | INTRAMUSCULAR | Status: AC
  Administered 2019-11-06: 500 mg via INTRAMUSCULAR
  Filled 2019-11-06: qty 500

## 2019-11-06 NOTE — Discharge Instructions (Signed)
Follow-up with the primary care provider listed below. Take antibiotics as directed. Return to the ER for worsening testicle pain or swelling, worsening back pain, abdominal pain with fever.

## 2019-11-06 NOTE — ED Provider Notes (Signed)
[facility] EMERGENCY DEPARTMENT Provider Note   CSN: [id number] Arrival date & time: 11/06/19  0855     History Chief Complaint  Patient presents with  . Testicle Pain    William Miranda is a 34 y.o. male with a past medical history of substance abuse, bronchitis presenting to the ED with a chief complaint of penile discharge and left testicle pain.  Reports intermittent penile discharge for the past year.  States that he has had several visits to the ED and urgent cares and has been told that he does not have STDs or UTIs.  He was recently treated for a UTI but states that he drinks quite a bit of alcohol while taking the antibiotic course so he does not know if the antibiotics worked.  His current episode of penile discharge began 1 week ago.  Last unprotected sexual intercourse was 3 weeks ago.  He reports left-sided lower back pain as well that he is concerned could be due to a kidney infection.  Last night started having aching left testicle pain.  Denies trauma to the area.  Denies abdominal pain, fever, hematuria.  Does note some dysuria. He is also requesting a refill of his albuterol inhaler.  HPI     Past Medical History:  Diagnosis Date  . Bronchitis   . ETOH abuse   . Polysubstance abuse (HCC)     There are no problems to display for this patient.   Past Surgical History:  Procedure Laterality Date  . HAND SURGERY     right  . HAND SURGERY         No family history on file.  Social History   Tobacco Use  . Smoking status: Former Smoker    Packs/day: 0.50    Types: Cigars  . Smokeless tobacco: Never Used  Vaping Use  . Vaping Use: Never used  Substance Use Topics  . Alcohol use: Yes    Comment: occ  . Drug use: Yes    Types: Marijuana    Comment: "x" pills (ecstasy "every day")    Home Medications Prior to Admission medications   Medication Sig Start Date End Date Taking? Authorizing Provider  cefdinir (OMNICEF) 300 MG capsule  Take 1 capsule (300 mg total) by mouth 2 (two) times daily for 7 days. 11/06/19 11/13/19  Alizabeth Antonio, PA-C  cyclobenzaprine (FLEXERIL) 5 MG tablet Take 1 tablet (5 mg total) by mouth 2 (two) times daily as needed for muscle spasms. 09/15/19   Horton, Mayer Masker, MD  doxycycline (VIBRAMYCIN) 100 MG capsule Take 1 capsule (100 mg total) by mouth 2 (two) times daily for 7 days. 11/06/19 11/13/19  Alcario Tinkey, PA-C  ibuprofen (ADVIL,MOTRIN) 600 MG tablet Take 1 tablet (600 mg total) by mouth every 6 (six) hours as needed. Patient not taking: Reported on 08/29/2018 06/22/18   Michela Pitcher A, PA-C  potassium chloride SA (KLOR-CON) 20 MEQ tablet Take 2 tablets (40 mEq total) by mouth daily for 5 days. 03/21/19 03/26/19  Long, Arlyss Repress, MD    Allergies    Patient has no known allergies.  Review of Systems   Review of Systems  Constitutional: Negative for appetite change, chills and fever.  HENT: Negative for ear pain, rhinorrhea, sneezing and sore throat.   Eyes: Negative for photophobia and visual disturbance.  Respiratory: Negative for cough, chest tightness, shortness of breath and wheezing.   Cardiovascular: Negative for chest pain and palpitations.  Gastrointestinal: Negative for abdominal pain, blood in  stool, constipation, diarrhea, nausea and vomiting.  Genitourinary: Positive for discharge, dysuria and testicular pain. Negative for hematuria and urgency.  Musculoskeletal: Negative for myalgias.  Skin: Negative for rash.  Neurological: Negative for dizziness, weakness and light-headedness.    Physical Exam Updated Vital Signs BP (!) 126/101 (BP Location: Left Arm)   Pulse 76   Temp 98.4 F (36.9 C) (Oral)   Resp 16   Ht 5\' 11"  (1.803 m)   Wt 86.5 kg   SpO2 98%   BMI 26.61 kg/m   Physical Exam Vitals and nursing note reviewed. Exam conducted with a chaperone present.  Constitutional:      General: He is not in acute distress.    Appearance: He is well-developed.  HENT:     Head:  Normocephalic and atraumatic.     Nose: Nose normal.  Eyes:     General: No scleral icterus.       Left eye: No discharge.     Conjunctiva/sclera: Conjunctivae normal.  Cardiovascular:     Rate and Rhythm: Normal rate and regular rhythm.     Heart sounds: Normal heart sounds. No murmur heard.  No friction rub. No gallop.   Pulmonary:     Effort: Pulmonary effort is normal. No respiratory distress.     Breath sounds: Normal breath sounds.  Abdominal:     General: Bowel sounds are normal. There is no distension.     Palpations: Abdomen is soft.     Tenderness: There is no abdominal tenderness. There is no guarding.  Genitourinary:    Penis: Circumcised. Discharge present.      Testes:        Right: Testicular hydrocele present. Tenderness not present.        Left: Tenderness and testicular hydrocele present.  Musculoskeletal:        General: Normal range of motion.     Cervical back: Normal range of motion and neck supple.  Skin:    General: Skin is warm and dry.     Findings: No rash.  Neurological:     Mental Status: He is alert.     Motor: No abnormal muscle tone.     Coordination: Coordination normal.     ED Results / Procedures / Treatments   Labs (all labs ordered are listed, but only abnormal results are displayed) Labs Reviewed  URINALYSIS, ROUTINE W REFLEX MICROSCOPIC - Abnormal; Notable for the following components:      Result Value   APPearance CLOUDY (*)    Hgb urine dipstick MODERATE (*)    Leukocytes,Ua MODERATE (*)    All other components within normal limits  URINALYSIS, MICROSCOPIC (REFLEX) - Abnormal; Notable for the following components:   Bacteria, UA MANY (*)    All other components within normal limits  CBC WITH DIFFERENTIAL/PLATELET - Abnormal; Notable for the following components:   WBC 13.0 (*)    Neutro Abs 8.5 (*)    Monocytes Absolute 1.4 (*)    All other components within normal limits  BASIC METABOLIC PANEL - Abnormal; Notable for the  following components:   Glucose, Bld 100 (*)    All other components within normal limits  URINE CULTURE  GC/CHLAMYDIA PROBE AMP ([place]) NOT AT [facility]    EKG None  Radiology CT Renal Stone Study  Result Date: 11/06/2019 CLINICAL DATA:  LEFT flank pain, UTI. EXAM: CT ABDOMEN AND PELVIS WITHOUT CONTRAST TECHNIQUE: Multidetector CT imaging of the abdomen and pelvis was performed following the standard protocol  without IV contrast. COMPARISON:  None. FINDINGS: Lower chest: Incidental imaging of the lung bases is unremarkable. Hepatobiliary: Liver with normal size and contour. No pericholecystic stranding. No gross biliary ductal dilation. Pancreas: Pancreas without signs of peripancreatic inflammation or abnormal contour. Spleen: Spleen normal size and contour. Adrenals/Urinary Tract: Adrenal glands are normal. No nephrolithiasis no hydronephrosis. Kidneys are normal size and contour. Stomach/Bowel: Small bowel without signs of obstruction. Stool fills much of the colon. No sign of acute gastrointestinal process. Normal appendix in the RIGHT lower quadrant. Vascular/Lymphatic: No aortic atheromatous changes. No aneurysmal dilation of the abdominal aorta. No adenopathy in the retroperitoneum or in the upper abdomen. No pelvic lymphadenopathy. Reproductive: Prostate is unremarkable. Limited assessment on CT. Urinary bladder is under distended limiting assessment. Other: No ascites.  No abdominal wall hernia. Musculoskeletal: No acute musculoskeletal process. No destructive bone finding. IMPRESSION: 1. No nephrolithiasis or hydronephrosis. No acute process in the abdomen or pelvis. 2. Stool fills much of the colon. Electronically Signed   By: Zetta Bills M.D.   On: 11/06/2019 14:00   US SCROTUM W/DOPPLER  Result Date: 11/06/2019 CLINICAL DATA:  Left testicular pain. EXAM: SCROTAL ULTRASOUND DOPPLER ULTRASOUND OF THE TESTICLES TECHNIQUE: Complete ultrasound examination of the testicles, epididymis,  and other scrotal structures was performed. Color and spectral Doppler ultrasound were also utilized to evaluate blood flow to the testicles. COMPARISON:  None. FINDINGS: Right testicle Measurements: 4.8 x 2.3 x 2.7 cm. No mass or microlithiasis visualized. Left testicle Measurements: 4.5 x 2.4 x 2.8 cm. No mass or microlithiasis visualized. Right epididymis:  Normal in size and appearance. Left epididymis:  Normal in size and appearance. Hydrocele:  Small bilateral hydroceles. Varicocele:  None visualized. Pulsed Doppler interrogation of both testes demonstrates normal low resistance arterial waveforms bilaterally and normal left testicular venous waveforms. No venous waveform images were taken for the asymptomatic right testicle. IMPRESSION: 1. Normal testicular ultrasound with symmetric bilateral vascularity. No evidence of torsion. 2.  Small bilateral hydroceles. Electronically Signed   By: Marin Olp M.D.   On: 11/06/2019 11:05    Procedures Procedures (including critical care time)  Medications Ordered in ED Medications  cefTRIAXone (ROCEPHIN) injection 500 mg (has no administration in time range)  albuterol (VENTOLIN HFA) 108 (90 Base) MCG/ACT inhaler 2 puff (2 puffs Inhalation Given 11/06/19 1229)    ED Course  I have reviewed the triage vital signs and the nursing notes.  Pertinent labs & imaging results that were available during my care of the patient were reviewed by me and considered in my medical decision making (see chart for details).    MDM Rules/Calculators/A&P                          34 year old male with a past medical history of substance abuse, bronchitis presenting to the ED with a chief complaint of penile discharge and left testicle pain.  Penile discharge for the past year, several visits to the ED with only one positive urine culture.  States that he was told all his STDs have been negative.  Reports lower left-sided back pain as well.  Reports left sided testicle  pain.  There is diffuse tenderness of the left testicle without focal tenderness or mass.  Ultrasound of the testicles shows no evidence of torsion or other abnormalities.  There are small bilateral hydroceles.  Urinalysis significant for moderate leukocytes, many bacteria, pyuria.  Leukocytosis of 13.  CT renal stone study without evidence of  stone or hydronephrosis.  Suspect that his symptoms could be due to epididymitis versus pyelonephritis.  Will treat with antibiotics for same, as recommended by pharmacist for cefdinir and doxycycline.  He will benefit from referral to PCP for further referrals to urology or other appropriate specialist.  Patient is agreeable to the plan.  Urine sent for culture.   Patient is hemodynamically stable, in NAD, and able to ambulate in the ED. Evaluation does not show pathology that would require ongoing emergent intervention or inpatient treatment. I explained the diagnosis to the patient. Pain has been managed and has no complaints prior to discharge. Patient is comfortable with above plan and is stable for discharge at this time. All questions were answered prior to disposition. Strict return precautions for returning to the ED were discussed. Encouraged follow up with PCP.   An After Visit Summary was printed and given to the patient.   Portions of this note were generated with Scientist, clinical (histocompatibility and immunogenetics). Dictation errors may occur despite best attempts at proofreading.  Final Clinical Impression(s) / ED Diagnoses Final diagnoses:  Pain in left testicle  Concern about STD in male without diagnosis  Lower urinary tract infectious disease    Rx / DC Orders ED Discharge Orders         Ordered    doxycycline (VIBRAMYCIN) 100 MG capsule  2 times daily     Discontinue  Reprint     11/06/19 1436    cefdinir (OMNICEF) 300 MG capsule  2 times daily     Discontinue  Reprint     11/06/19 1436           Dietrich Pates, PA-C 11/06/19 1437    Pricilla Loveless,  MD 11/06/19 1743

## 2019-11-06 NOTE — ED Triage Notes (Addendum)
Penile discharge intermittently x3-4 months.  This episode x1 week.  Sts the discharge only happens when he uses Ecstasy.  Left testicle pain since last night.

## 2019-11-07 LAB — URINE CULTURE: Culture: NO GROWTH

## 2019-11-10 LAB — GC/CHLAMYDIA PROBE AMP (~~LOC~~) NOT AT ARMC
Chlamydia: NEGATIVE
Comment: NEGATIVE
Comment: NORMAL
Neisseria Gonorrhea: POSITIVE — AB

## 2019-11-16 ENCOUNTER — Telehealth (HOSPITAL_BASED_OUTPATIENT_CLINIC_OR_DEPARTMENT_OTHER): Payer: Self-pay

## 2019-11-16 MED FILL — CEFDINIR 300 MG CAPSULE: 300 | 7 days supply | Qty: 14 | Fill #0

## 2019-11-16 MED FILL — DOXYCYCLINE HYCLATE 100 MG: 100 | 7 days supply | Qty: 14 | Fill #0

## 2019-11-16 NOTE — Telephone Encounter (Signed)
Pt presents to ED stating he lost rx abx x 2 from 6/11 visit-discussed with Cleatrice Burke, Charge RN-states to have [facility] refill abx x 2-[facility] notified-pt notified

## 2019-12-25 ENCOUNTER — Emergency Department (HOSPITAL_BASED_OUTPATIENT_CLINIC_OR_DEPARTMENT_OTHER)
Admission: EM | Admit: 2019-12-25 | Discharge: 2019-12-25 | Disposition: A | Discharge status: Home / Self Care | Payer: Self-pay | Attending: Emergency Medicine | Admitting: Emergency Medicine

## 2019-12-25 ENCOUNTER — Encounter (HOSPITAL_BASED_OUTPATIENT_CLINIC_OR_DEPARTMENT_OTHER): Payer: Self-pay | Admitting: Emergency Medicine

## 2019-12-25 ENCOUNTER — Other Ambulatory Visit: Payer: Self-pay

## 2019-12-25 DIAGNOSIS — F1590 Other stimulant use, unspecified, uncomplicated: Secondary | ICD-10-CM | POA: Insufficient documentation

## 2019-12-25 DIAGNOSIS — R300 Dysuria: Secondary | ICD-10-CM

## 2019-12-25 DIAGNOSIS — Z711 Person with feared health complaint in whom no diagnosis is made: Secondary | ICD-10-CM

## 2019-12-25 DIAGNOSIS — F17290 Nicotine dependence, other tobacco product, uncomplicated: Secondary | ICD-10-CM | POA: Insufficient documentation

## 2019-12-25 LAB — URINALYSIS, ROUTINE W REFLEX MICROSCOPIC
Bilirubin Urine: NEGATIVE
Glucose, UA: NEGATIVE mg/dL
Ketones, ur: NEGATIVE mg/dL
Leukocytes,Ua: NEGATIVE
Nitrite: NEGATIVE
Protein, ur: NEGATIVE mg/dL
Specific Gravity, Urine: >1.03 — ABNORMAL HIGH (ref 1.005–1.030)
pH: 5.5 (ref 5.0–8.0)

## 2019-12-25 LAB — URINALYSIS, MICROSCOPIC (REFLEX)

## 2019-12-25 MED ORDER — CEFTRIAXONE SODIUM 500 MG IJ SOLR
500.0000 mg | Freq: Once | INTRAMUSCULAR | Status: AC
  Administered 2019-12-25: 500 mg via INTRAMUSCULAR
  Filled 2019-12-25: qty 500

## 2019-12-25 MED ORDER — AZITHROMYCIN 1 G PO PACK
1.0000 g | PACK | Freq: Once | ORAL | Status: AC
  Administered 2019-12-25: 1 g via ORAL
  Filled 2019-12-25: qty 1

## 2019-12-25 NOTE — ED Provider Notes (Signed)
[facility] Provider Note: Lowella Dell, MD, FACEP  CSN: [id number] MRN: [id number] ARRIVAL: 12/25/19 at 0325 ROOM: [facility]08/[facility]08   CHIEF COMPLAINT  Penile Discomfort   HISTORY OF PRESENT ILLNESS  12/25/19 5:03 AM William Miranda is a 34 y.o. male with several days of tingling at the end of his penis.  He denies actual pain.  Symptoms are mild.  He has not noticed a urethral discharge.  He admits to marital fidelity.  He is here with his wife who is also being evaluated for STDs.   Past Medical History:  Diagnosis Date  . Bronchitis   . ETOH abuse   . Polysubstance abuse [facility])     Past Surgical History:  Procedure Laterality Date  . HAND SURGERY     right  . HAND SURGERY      History reviewed. No pertinent family history.  Social History   Tobacco Use  . Smoking status: Current Every Day Smoker    Packs/day: 0.50    Types: Cigars  . Smokeless tobacco: Never Used  Vaping Use  . Vaping Use: Never used  Substance Use Topics  . Alcohol use: Yes    Comment: occ  . Drug use: Yes    Types: Marijuana    Comment: "x" pills (ecstasy "every day")    Prior to Admission medications   Medication Sig Start Date End Date Taking? Authorizing Provider  potassium chloride SA (KLOR-CON) 20 MEQ tablet Take 2 tablets (40 mEq total) by mouth daily for 5 days. 03/21/19 12/25/19  Long, Arlyss Repress, MD    Allergies Patient has no known allergies.   REVIEW OF SYSTEMS  Negative except as noted here or in the History of Present Illness.   PHYSICAL EXAMINATION  Initial Vital Signs Blood pressure (!) 137/100, pulse 97, temperature 98.7 F (37.1 C), resp. rate 16, SpO2 99 %.  Examination General: Well-developed, well-nourished male in no acute distress; appearance consistent with age of record HENT: normocephalic; atraumatic Eyes: Normal appearance Neck: supple Heart: regular rate and rhythm Lungs: clear to auscultation bilaterally Abdomen: soft; nondistended;  nontender; bowel sounds present GU: Tanner V male, circumcised; no urethral discharge or inflammation Extremities: No deformity; full range of motion Neurologic: Awake, alert and oriented; motor function intact in all extremities and symmetric; no facial droop Skin: Warm and dry Psychiatric: Normal mood and affect   RESULTS  Summary of this visit's results, reviewed and interpreted by myself:   EKG Interpretation  Date/Time:    Ventricular Rate:    PR Interval:    QRS Duration:   QT Interval:    QTC Calculation:   R Axis:     Text Interpretation:        Laboratory Studies: Results for orders placed or performed during the hospital encounter of 12/25/19 (from the past 24 hour(s))  Urinalysis, Routine w reflex microscopic     Status: Abnormal   Collection Time: 12/25/19  4:00 AM  Result Value Ref Range   Color, Urine YELLOW YELLOW   APPearance CLEAR CLEAR   Specific Gravity, Urine >1.030 (H) 1.005 - 1.030   pH 5.5 5.0 - 8.0   Glucose, UA NEGATIVE NEGATIVE mg/dL   Hgb urine dipstick SMALL (A) NEGATIVE   Bilirubin Urine NEGATIVE NEGATIVE   Ketones, ur NEGATIVE NEGATIVE mg/dL   Protein, ur NEGATIVE NEGATIVE mg/dL   Nitrite NEGATIVE NEGATIVE   Leukocytes,Ua NEGATIVE NEGATIVE  Urinalysis, Microscopic (reflex)     Status: Abnormal   Collection Time: 12/25/19  4:00 AM  Result Value Ref Range   RBC / HPF 0-5 0 - 5 RBC/hpf   WBC, UA 6-10 0 - 5 WBC/hpf   Bacteria, UA FEW (A) NONE SEEN   Squamous Epithelial / LPF 0-5 0 - 5   Mucus PRESENT    Imaging Studies: No results found.  ED COURSE and MDM  Nursing notes, initial and subsequent vitals signs, including pulse oximetry, reviewed and interpreted by myself.  Vitals:   12/25/19 0334  BP: (!) 137/100  Pulse: 97  Resp: 16  Temp: 98.7 F (37.1 C)  SpO2: 99%   Medications  cefTRIAXone (ROCEPHIN) injection 500 mg (has no administration in time range)  azithromycin (ZITHROMAX) powder 1 g (has no administration in time  range)    The patient's examination and history are not definitive for a diagnosis of urethritis but given the prevalence of STDs in the area we will go ahead and treat for gonorrhea and chlamydia.  He was advised to be celibate for at least a week after he and his wife are treated.  PROCEDURES  Procedures   ED DIAGNOSES     ICD-10-CM   1. Dysuria  R30.0   2. Concern about STD in male without diagnosis  Z71.1        Nikky Duba, Jonny Ruiz, MD 12/25/19 [id number]

## 2019-12-25 NOTE — ED Triage Notes (Signed)
Pt arrives with spouse complaining of penile "tingling" since having sex with wife causing significant bleeding for her. Concern for STDs, denies discharge

## 2019-12-28 LAB — GC/CHLAMYDIA PROBE AMP (~~LOC~~) NOT AT ARMC
Chlamydia: NEGATIVE
Comment: NEGATIVE
Comment: NORMAL
Neisseria Gonorrhea: NEGATIVE

## 2020-06-10 ENCOUNTER — Emergency Department (HOSPITAL_BASED_OUTPATIENT_CLINIC_OR_DEPARTMENT_OTHER)
Admission: EM | Admit: 2020-06-10 | Discharge: 2020-06-10 | Disposition: A | Discharge status: Home / Self Care | Payer: Self-pay | Attending: Emergency Medicine | Admitting: Emergency Medicine

## 2020-06-10 ENCOUNTER — Encounter (HOSPITAL_BASED_OUTPATIENT_CLINIC_OR_DEPARTMENT_OTHER): Payer: Self-pay

## 2020-06-10 ENCOUNTER — Emergency Department (HOSPITAL_BASED_OUTPATIENT_CLINIC_OR_DEPARTMENT_OTHER): Payer: Self-pay

## 2020-06-10 ENCOUNTER — Other Ambulatory Visit: Payer: Self-pay

## 2020-06-10 DIAGNOSIS — R0602 Shortness of breath: Secondary | ICD-10-CM | POA: Insufficient documentation

## 2020-06-10 DIAGNOSIS — S39012A Strain of muscle, fascia and tendon of lower back, initial encounter: Secondary | ICD-10-CM | POA: Insufficient documentation

## 2020-06-10 DIAGNOSIS — Z87891 Personal history of nicotine dependence: Secondary | ICD-10-CM | POA: Insufficient documentation

## 2020-06-10 DIAGNOSIS — R0789 Other chest pain: Secondary | ICD-10-CM | POA: Insufficient documentation

## 2020-06-10 DIAGNOSIS — R000 Tachycardia, unspecified: Secondary | ICD-10-CM | POA: Insufficient documentation

## 2020-06-10 DIAGNOSIS — X58XXXA Exposure to other specified factors, initial encounter: Secondary | ICD-10-CM | POA: Insufficient documentation

## 2020-06-10 DIAGNOSIS — R110 Nausea: Secondary | ICD-10-CM | POA: Insufficient documentation

## 2020-06-10 LAB — HEPATIC FUNCTION PANEL
ALT: 42 U/L (ref 0–44)
AST: 23 U/L (ref 15–41)
Albumin: 4.3 g/dL (ref 3.5–5.0)
Alkaline Phosphatase: 110 U/L (ref 38–126)
Bilirubin, Direct: 0.1 mg/dL (ref 0.0–0.2)
Indirect Bilirubin: 0.4 mg/dL (ref 0.3–0.9)
Total Bilirubin: 0.5 mg/dL (ref 0.3–1.2)
Total Protein: 7.4 g/dL (ref 6.5–8.1)

## 2020-06-10 LAB — CBC
HCT: 44.5 % (ref 39.0–52.0)
Hemoglobin: 15 g/dL (ref 13.0–17.0)
MCH: 29.9 pg (ref 26.0–34.0)
MCHC: 33.7 g/dL (ref 30.0–36.0)
MCV: 88.6 fL (ref 80.0–100.0)
Platelets: 253 10*3/uL (ref 150–400)
RBC: 5.02 MIL/uL (ref 4.22–5.81)
RDW: 13.6 % (ref 11.5–15.5)
WBC: 11 10*3/uL — ABNORMAL HIGH (ref 4.0–10.5)
nRBC: 0 % (ref 0.0–0.2)

## 2020-06-10 LAB — BASIC METABOLIC PANEL
Anion gap: 9 (ref 5–15)
BUN: 19 mg/dL (ref 6–20)
CO2: 23 mmol/L (ref 22–32)
Calcium: 9.4 mg/dL (ref 8.9–10.3)
Chloride: 107 mmol/L (ref 98–111)
Creatinine, Ser: 0.94 mg/dL (ref 0.61–1.24)
GFR, Estimated: >60 mL/min (ref >60)
Glucose, Bld: 133 mg/dL — ABNORMAL HIGH (ref 70–99)
Potassium: 3.8 mmol/L (ref 3.5–5.1)
Sodium: 139 mmol/L (ref 135–145)

## 2020-06-10 LAB — URINALYSIS, ROUTINE W REFLEX MICROSCOPIC
Bilirubin Urine: NEGATIVE
Glucose, UA: NEGATIVE mg/dL
Hgb urine dipstick: NEGATIVE
Ketones, ur: NEGATIVE mg/dL
Leukocytes,Ua: NEGATIVE
Nitrite: NEGATIVE
Protein, ur: NEGATIVE mg/dL
Specific Gravity, Urine: 1.03 (ref 1.005–1.030)
pH: 6 (ref 5.0–8.0)

## 2020-06-10 LAB — D-DIMER, QUANTITATIVE: D-Dimer, Quant: <0.27 ug/mL-FEU (ref 0.00–0.50)

## 2020-06-10 MED ORDER — HYDROCODONE-ACETAMINOPHEN 5-325 MG PO TABS
1.0000 | ORAL_TABLET | Freq: Once | ORAL | Status: AC
  Administered 2020-06-10: 1 via ORAL
  Filled 2020-06-10: qty 1

## 2020-06-10 MED ORDER — MORPHINE SULFATE (PF) 4 MG/ML IV SOLN: 4.0000 mg | Freq: Once | INTRAVENOUS | Status: DC

## 2020-06-10 MED ORDER — SODIUM CHLORIDE 0.9 % IV BOLUS: 500.0000 mL | Freq: Once | INTRAVENOUS | Status: DC

## 2020-06-10 MED ORDER — NAPROXEN 500 MG PO TABS: 500.0000 mg | ORAL_TABLET | Freq: Two times a day (BID) | ORAL | 0 refills | Status: DC

## 2020-06-10 MED ORDER — METHOCARBAMOL 500 MG PO TABS: 500.0000 mg | ORAL_TABLET | Freq: Two times a day (BID) | ORAL | 0 refills | Status: DC

## 2020-06-10 MED ORDER — METHOCARBAMOL 500 MG PO TABS
500.0000 mg | ORAL_TABLET | Freq: Once | ORAL | Status: AC
  Administered 2020-06-10: 500 mg via ORAL
  Filled 2020-06-10: qty 1

## 2020-06-10 NOTE — Discharge Instructions (Addendum)
Your lab work and imaging today was reassuring. There are no obvious findings concerning for appendicitis.  However if your pain worsens or you develop a fever, you will need to return to the ER as this may have progressed to appendicitis. Take medications as needed. You can also return to the ER for any chest pain, shortness of breath or bloody stools.

## 2020-06-10 NOTE — ED Provider Notes (Signed)
[facility] EMERGENCY DEPARTMENT Provider Note   CSN: [id number] Arrival date & time: 06/10/20  1537     History Chief Complaint  Patient presents with  . Flank Pain  . Chest Pain    William Miranda is a 35 y.o. male with a past medical history of alcohol abuse, polysubstance abuse presenting to the ED with a chief complaint of right flank pain. 5 days ago started experiencing pain "in my right kidney." States that he thought it was because he was dehydrated so he started drinking a lot of water. Had 1 episode of nonbloody, nonbilious emesis that day but none subsequently. States that the pain is now persisted throughout his entire right abdomen and back and radiating up to his chest. He denies any changes to bowel movements. He may have some dysuria but he is unsure. No testicular pain or swelling. States that he has some associated shortness of breath but he is unsure why as this is unusual for him. He cannot recall any incident that may have triggered this other than believing he may have been dehydrated. No prior history of pyelonephritis or kidney stone. No leg swelling, history of DVT, PE.  HPI     Past Medical History:  Diagnosis Date  . Bronchitis   . ETOH abuse   . Polysubstance abuse (HCC)     There are no problems to display for this patient.   Past Surgical History:  Procedure Laterality Date  . HAND SURGERY     right  . HAND SURGERY         No family history on file.  Social History   Tobacco Use  . Smoking status: Former Smoker    Packs/day: 0.50    Types: Cigars  . Smokeless tobacco: Never Used  Vaping Use  . Vaping Use: Never used  Substance Use Topics  . Alcohol use: Not Currently  . Drug use: Not Currently    Home Medications Prior to Admission medications   Medication Sig Start Date End Date Taking? Authorizing Provider  methocarbamol (ROBAXIN) 500 MG tablet Take 1 tablet (500 mg total) by mouth 2 (two) times daily. 06/10/20   Yes Derell Bruun, PA-C  naproxen (NAPROSYN) 500 MG tablet Take 1 tablet (500 mg total) by mouth 2 (two) times daily. 06/10/20  Yes Sujey Gundry, PA-C  potassium chloride SA (KLOR-CON) 20 MEQ tablet Take 2 tablets (40 mEq total) by mouth daily for 5 days. 03/21/19 12/25/19  Long, Arlyss Repress, MD    Allergies    Patient has no known allergies.  Review of Systems   Review of Systems  Constitutional: Negative for appetite change, chills and fever.  HENT: Negative for ear pain, rhinorrhea, sneezing and sore throat.   Eyes: Negative for photophobia and visual disturbance.  Respiratory: Positive for shortness of breath. Negative for cough, chest tightness and wheezing.   Cardiovascular: Positive for chest pain. Negative for palpitations.  Gastrointestinal: Positive for abdominal pain and vomiting. Negative for blood in stool, constipation, diarrhea and nausea.  Genitourinary: Positive for flank pain. Negative for dysuria, hematuria and urgency.  Musculoskeletal: Negative for myalgias.  Skin: Negative for rash.  Neurological: Negative for dizziness, weakness and light-headedness.    Physical Exam Updated Vital Signs BP 113/75 (BP Location: Right Arm)   Pulse 72   Temp 98.6 F (37 C) (Oral)   Resp 18   SpO2 98%   Physical Exam Vitals and nursing note reviewed.  Constitutional:      General:  He is not in acute distress.    Appearance: He is well-developed and well-nourished.  HENT:     Head: Normocephalic and atraumatic.     Nose: Nose normal.  Eyes:     General: No scleral icterus.       Right eye: No discharge.        Left eye: No discharge.     Extraocular Movements: EOM normal.     Conjunctiva/sclera: Conjunctivae normal.  Cardiovascular:     Rate and Rhythm: Regular rhythm. Tachycardia present.     Pulses: Intact distal pulses.     Heart sounds: Normal heart sounds. No murmur heard. No friction rub. No gallop.   Pulmonary:     Effort: Pulmonary effort is normal. No  respiratory distress.     Breath sounds: Normal breath sounds.  Abdominal:     General: Bowel sounds are normal. There is no distension.     Palpations: Abdomen is soft.     Tenderness: There is no abdominal tenderness. There is no right CVA tenderness, left CVA tenderness or guarding.  Musculoskeletal:        General: No edema. Normal range of motion.     Cervical back: Normal range of motion and neck supple.  Skin:    General: Skin is warm and dry.     Findings: No rash.  Neurological:     Mental Status: He is alert.     Motor: No abnormal muscle tone.     Coordination: Coordination normal.  Psychiatric:        Mood and Affect: Mood and affect normal.     ED Results / Procedures / Treatments   Labs (all labs ordered are listed, but only abnormal results are displayed) Labs Reviewed  CBC - Abnormal; Notable for the following components:      Result Value   WBC 11.0 (*)    All other components within normal limits  BASIC METABOLIC PANEL - Abnormal; Notable for the following components:   Glucose, Bld 133 (*)    All other components within normal limits  URINE CULTURE  URINALYSIS, ROUTINE W REFLEX MICROSCOPIC  D-DIMER, QUANTITATIVE (NOT AT [facility])  HEPATIC FUNCTION PANEL    EKG None  Radiology DG Chest 2 View  Result Date: 06/10/2020 CLINICAL DATA:  Chest pain and shortness of breath x5 days. EXAM: CHEST - 2 VIEW COMPARISON:  March 21, 2019 FINDINGS: The heart size and mediastinal contours are within normal limits. Both lungs are clear. The visualized skeletal structures are unremarkable. IMPRESSION: No active cardiopulmonary disease. Electronically Signed   By: Aram Candela M.D.   On: 06/10/2020 17:54   CT Renal Stone Study  Result Date: 06/10/2020 CLINICAL DATA:  right flank pain, no hematuria, urine frequency x 5 days -- no hx kidney stones EXAM: CT ABDOMEN AND PELVIS WITHOUT CONTRAST TECHNIQUE: Multidetector CT imaging of the abdomen and pelvis was performed  following the standard protocol without IV contrast. COMPARISON:  CT abdomen pelvis 11/06/2019 FINDINGS: Lower chest: No acute abnormality. Persistent paraesophageal density likely represents a lymph node (2:2, 5). Hepatobiliary: No focal liver abnormality. No gallstones, gallbladder wall thickening, or pericholecystic fluid. No biliary dilatation. Pancreas: No focal lesion. Normal pancreatic contour. No surrounding inflammatory changes. No main pancreatic ductal dilatation. Spleen: Normal in size without focal abnormality. Adrenals/Urinary Tract: No adrenal nodule bilaterally. No nephrolithiasis, no hydronephrosis, and no contour-deforming renal mass. No ureterolithiasis or hydroureter. The urinary bladder is unremarkable. Stomach/Bowel: Stomach is within normal limits. No evidence of  bowel wall thickening or dilatation. The appendix measures slightly enlarged in size (8 mm) compared to prior with no definite associated periappendiceal inflammatory changes. Suggestion of tiny punctate density within the appendiceal lumen could represent tiny appendicoliths. Interval resolution of intraluminal gas. Vascular/Lymphatic: No significant vascular findings are present. No enlarged abdominal or pelvic lymph nodes. Reproductive: Prostate is unremarkable. Other: No intraperitoneal free fluid. No intraperitoneal free gas. No organized fluid collection. Musculoskeletal: No abdominal wall hernia or abnormality. No suspicious lytic or blastic osseous lesions. No acute displaced fracture. Multilevel degenerative changes of the spine. IMPRESSION: 1. The appendix measures minimally enlarged in size compared to prior (now 8 mm) with persistent tiny hyperdensities within the appendiceal lumen that could represent appendicoliths. No definite periappendiceal fat stranding or right lower quadrant inflammatory changes. Findings equivocal, please correlate clinically for possible early/mild acute appendicitis. 2. No nephroureterolithiasis  bilaterally. Electronically Signed   By: Morgane  Naveau M.D.   On: 06/10/2020 18:52 Tish Frederickson   Procedures Procedures (including critical care time)  Medications Ordered in ED Medications  methocarbamol (ROBAXIN) tablet 500 mg (500 mg Oral Given 06/10/20 1731)    ED Course  I have reviewed the triage vital signs and the nursing notes.  Pertinent labs & imaging results that were available during my care of the patient were reviewed by me and considered in my medical decision making (see chart for details).  Clinical Course as of 06/10/20 2007  Fri Jun 10, 2020  1806 Hgb urine dipstick: NEGATIVE [HK]  1806 Nitrite: NEGATIVE [HK]  1806 Leukocytes,Ua: NEGATIVE [HK]  1815 D-Dimer, Quant: <0.27 [HK]  1819 Creatinine: 0.94 [HK]  1827 Hepatic function panel No abnormalities. [HK]  1911 CT Renal Stone Study Equivocal for possible early appendicitis.  On recheck patient without any focal right lower quadrant tenderness. [HK]  1955 Spoke to Dr. Gerrit FriendsGerkin, on call surgery who will view patient's CT scan and lab work and call me back. [HK]  2003 Spoke to Dr. Gerrit FriendsGerkin who reviewed patient's CT scan and lab findings.  He does not feel that his CT shows acute appendicitis and feels that it is largely similar to his imaging in June.  He recommends giving return precautions for any worsening pain or fever. [HK]    Clinical Course User Index [HK] Dietrich PatesKhatri, Angelmarie Ponzo, PA-C   MDM Rules/Calculators/A&P                          35 year old male with past medical history of alcohol abuse, polysubstance abuse presenting to the ED with a chief complaint of right flank pain.  5 days ago started having pain in his back which is now radiating to his abdomen and right side of his chest.  Reports associated shortness of breath.  Had 1 episode of nonbloody, nonbilious emesis the day the pain began but none subsequently.  Denies any leg swelling, history of DVT or PE, pyelonephritis or kidney stone.  On exam abdomen is soft.  He is  initially tachycardic but not tachypneic.  Lungs are clear to auscultation bilaterally.  He is overall nontoxic-appearing.  Urinalysis without evidence of infection or hematuria.  EKG shows sinus rhythm, no STEMI and his tachycardia has improved spontaneously.  Chest x-ray without any acute abnormalities.  D-dimer obtained due to history chest pain, shortness of breath as well as his initial tachycardia and was negative.  CBC, BMP, LFTs unremarkable.  CT renal stone study with possible enlarged appendix with possible appendicoliths.  Findings are equivocal  for acute appendicitis.  Patient without any focal right lower quadrant tenderness.  Per recommendation of general surgeon on-call do not feel that this is due to appendicitis as his imaging is largely similar to his imaging last year.  Patient is agreeable to outpatient follow-up and returning for worsening symptoms.  Will treat symptomatically for possible musculoskeletal pain.  No evidence of kidney stone, pyelonephritis, UTI or other hepatic abnormality.    Patient is hemodynamically stable, in NAD, and able to ambulate in the ED. Evaluation does not show pathology that would require ongoing emergent intervention or inpatient treatment. I explained the diagnosis to the patient. Pain has been managed and has no complaints prior to discharge. Patient is comfortable with above plan and is stable for discharge at this time. All questions were answered prior to disposition. Strict return precautions for returning to the ED were discussed. Encouraged follow up with PCP.   An After Visit Summary was printed and given to the patient.   Portions of this note were generated with Scientist, clinical (histocompatibility and immunogenetics). Dictation errors may occur despite best attempts at proofreading.  Final Clinical Impression(s) / ED Diagnoses Final diagnoses:  Chest wall pain  Strain of lumbar region, initial encounter    Rx / DC Orders ED Discharge Orders         Ordered     methocarbamol (ROBAXIN) 500 MG tablet  2 times daily        06/10/20 2005    naproxen (NAPROSYN) 500 MG tablet  2 times daily        06/10/20 2005           Dietrich Pates, PA-C 06/10/20 2007    Rozelle Logan, DO 06/11/20 0007

## 2020-06-10 NOTE — ED Triage Notes (Signed)
Pt c/o right flank pain/abd pain x 4 days-NAD-steady gait

## 2020-06-12 LAB — URINE CULTURE: Culture: NO GROWTH

## 2020-08-10 ENCOUNTER — Emergency Department (HOSPITAL_BASED_OUTPATIENT_CLINIC_OR_DEPARTMENT_OTHER): Admission: EM | Admit: 2020-08-10 | Discharge: 2020-08-10 | Discharge status: Against Medical Advice | Payer: Self-pay

## 2020-08-10 ENCOUNTER — Other Ambulatory Visit: Payer: Self-pay

## 2020-11-29 ENCOUNTER — Emergency Department (HOSPITAL_BASED_OUTPATIENT_CLINIC_OR_DEPARTMENT_OTHER)
Admission: EM | Admit: 2020-11-29 | Discharge: 2020-11-29 | Disposition: A | Discharge status: Home / Self Care | Payer: Self-pay | Attending: Emergency Medicine | Admitting: Emergency Medicine

## 2020-11-29 ENCOUNTER — Encounter (HOSPITAL_BASED_OUTPATIENT_CLINIC_OR_DEPARTMENT_OTHER): Payer: Self-pay

## 2020-11-29 ENCOUNTER — Other Ambulatory Visit (HOSPITAL_BASED_OUTPATIENT_CLINIC_OR_DEPARTMENT_OTHER): Payer: Self-pay

## 2020-11-29 ENCOUNTER — Other Ambulatory Visit: Payer: Self-pay

## 2020-11-29 DIAGNOSIS — Z7689 Persons encountering health services in other specified circumstances: Secondary | ICD-10-CM

## 2020-11-29 DIAGNOSIS — Z87891 Personal history of nicotine dependence: Secondary | ICD-10-CM | POA: Insufficient documentation

## 2020-11-29 DIAGNOSIS — Z113 Encounter for screening for infections with a predominantly sexual mode of transmission: Secondary | ICD-10-CM | POA: Insufficient documentation

## 2020-11-29 DIAGNOSIS — R1030 Lower abdominal pain, unspecified: Secondary | ICD-10-CM | POA: Insufficient documentation

## 2020-11-29 LAB — CBC WITH DIFFERENTIAL/PLATELET
Abs Immature Granulocytes: 0.03 10*3/uL (ref 0.00–0.07)
Basophils Absolute: 0.1 10*3/uL (ref 0.0–0.1)
Basophils Relative: 1 %
Eosinophils Absolute: 0.4 10*3/uL (ref 0.0–0.5)
Eosinophils Relative: 5 %
HCT: 42.3 % (ref 39.0–52.0)
Hemoglobin: 14.3 g/dL (ref 13.0–17.0)
Immature Granulocytes: 0 %
Lymphocytes Relative: 30 %
Lymphs Abs: 2.3 10*3/uL (ref 0.7–4.0)
MCH: 29.8 pg (ref 26.0–34.0)
MCHC: 33.8 g/dL (ref 30.0–36.0)
MCV: 88.1 fL (ref 80.0–100.0)
Monocytes Absolute: 0.7 10*3/uL (ref 0.1–1.0)
Monocytes Relative: 9 %
Neutro Abs: 4.4 10*3/uL (ref 1.7–7.7)
Neutrophils Relative %: 55 %
Platelets: 227 10*3/uL (ref 150–400)
RBC: 4.8 MIL/uL (ref 4.22–5.81)
RDW: 13.6 % (ref 11.5–15.5)
WBC: 7.9 10*3/uL (ref 4.0–10.5)
nRBC: 0 % (ref 0.0–0.2)

## 2020-11-29 LAB — URINALYSIS, ROUTINE W REFLEX MICROSCOPIC
Bilirubin Urine: NEGATIVE
Glucose, UA: NEGATIVE mg/dL
Ketones, ur: NEGATIVE mg/dL
Leukocytes,Ua: NEGATIVE
Nitrite: NEGATIVE
Protein, ur: NEGATIVE mg/dL
Specific Gravity, Urine: >1.03 — ABNORMAL HIGH (ref 1.005–1.030)
pH: 5.5 (ref 5.0–8.0)

## 2020-11-29 LAB — COMPREHENSIVE METABOLIC PANEL
ALT: 16 U/L (ref 0–44)
AST: 15 U/L (ref 15–41)
Albumin: 3.6 g/dL (ref 3.5–5.0)
Alkaline Phosphatase: 116 U/L (ref 38–126)
Anion gap: 6 (ref 5–15)
BUN: 16 mg/dL (ref 6–20)
CO2: 25 mmol/L (ref 22–32)
Calcium: 8.7 mg/dL — ABNORMAL LOW (ref 8.9–10.3)
Chloride: 107 mmol/L (ref 98–111)
Creatinine, Ser: 0.95 mg/dL (ref 0.61–1.24)
GFR, Estimated: >60 mL/min (ref >60)
Glucose, Bld: 111 mg/dL — ABNORMAL HIGH (ref 70–99)
Potassium: 3.7 mmol/L (ref 3.5–5.1)
Sodium: 138 mmol/L (ref 135–145)
Total Bilirubin: 0.1 mg/dL — ABNORMAL LOW (ref 0.3–1.2)
Total Protein: 6.4 g/dL — ABNORMAL LOW (ref 6.5–8.1)

## 2020-11-29 LAB — URINALYSIS, MICROSCOPIC (REFLEX)

## 2020-11-29 LAB — LIPASE, BLOOD: Lipase: 27 U/L (ref 11–51)

## 2020-11-29 LAB — HIV ANTIBODY (ROUTINE TESTING W REFLEX): HIV Screen 4th Generation wRfx: NONREACTIVE

## 2020-11-29 MED ORDER — LIDOCAINE HCL (PF) 1 % IJ SOLN
1.0000 mL | Freq: Once | INTRAMUSCULAR | Status: AC
  Administered 2020-11-29: 1 mL
  Filled 2020-11-29: qty 5

## 2020-11-29 MED ORDER — DOXYCYCLINE HYCLATE 100 MG PO TABS
100.0000 mg | ORAL_TABLET | Freq: Two times a day (BID) | ORAL | 0 refills | Status: AC
  Filled 2020-11-29: qty 14, 7d supply, fill #0

## 2020-11-29 MED ORDER — CEFTRIAXONE SODIUM 500 MG IJ SOLR
500.0000 mg | Freq: Once | INTRAMUSCULAR | Status: AC
  Administered 2020-11-29: 500 mg via INTRAMUSCULAR
  Filled 2020-11-29: qty 500

## 2020-11-29 MED ORDER — METRONIDAZOLE 500 MG PO TABS
2000.0000 mg | ORAL_TABLET | Freq: Once | ORAL | Status: AC
  Administered 2020-11-29: 2000 mg via ORAL
  Filled 2020-11-29: qty 4

## 2020-11-29 MED ORDER — ONDANSETRON 4 MG PO TBDP
8.0000 mg | ORAL_TABLET | Freq: Once | ORAL | Status: AC
  Administered 2020-11-29: 8 mg via ORAL
  Filled 2020-11-29: qty 2

## 2020-11-29 NOTE — ED Notes (Signed)
Spoke with Josh in lab to add on urine culture 

## 2020-11-29 NOTE — ED Triage Notes (Signed)
Pt c/o lower abdominal pain with diarrhea and trouble urinating. Nausea but no vomiting.

## 2020-11-29 NOTE — ED Notes (Signed)
In room to see patient.  Reports discomfort with urination with penile discharge.  Endorses having intercourse a few weeks ago without using protection.  Requesting testing for STD's.  Provider made aware.

## 2020-11-29 NOTE — ED Provider Notes (Signed)
[facility] EMERGENCY DEPARTMENT Provider Note   CSN: [id number] Arrival date & time: 11/29/20  1119     History Chief Complaint  Patient presents with   Abdominal Pain    William Miranda is a 35 y.o. male with a past medical history of EtOH use and polysubstance use.  Patient presents emergency department with a chief complaint of lower abdominal pain and concern for STI exposure.  Patient reports that he had unprotected sex with a new male partner last week.  Patient states that yesterday he noticed light green penile discharge.  Patient also endorses sore to penis and dysuria.  Patient denies any testicular pain, swelling or tenderness to genitals, difficulty urinating, urinary frequency, hematuria, flank pain.  Patient endorses intermittent suprapubic pain over the last 2 weeks.  Patient rates pain 8/10 on the pain scale.  Patient denies any alleviating or aggravating factors.  Patient states he is currently having pain at present.  Patient denies any nausea, vomiting, blood in stool, melena, diarrhea, constipation, rectal pain, pain with defecation, fevers, chills.  Patient denies any receptive anal sex.   Abdominal Pain Associated symptoms: no chest pain, no chills, no dysuria, no fever, no nausea, no shortness of breath, no sore throat and no vomiting       Past Medical History:  Diagnosis Date   Bronchitis    ETOH abuse    Polysubstance abuse (HCC)     There are no problems to display for this patient.   Past Surgical History:  Procedure Laterality Date   HAND SURGERY     right   HAND SURGERY         History reviewed. No pertinent family history.  Social History   Tobacco Use   Smoking status: Former    Packs/day: 0.50    Pack years: 0.00    Types: Cigars, Cigarettes   Smokeless tobacco: Never  Vaping Use   Vaping Use: Never used  Substance Use Topics   Alcohol use: Yes    Comment: every other day   Drug use: Yes    Types:  Marijuana    Home Medications Prior to Admission medications   Medication Sig Start Date End Date Taking? Authorizing Provider  methocarbamol (ROBAXIN) 500 MG tablet Take 1 tablet (500 mg total) by mouth 2 (two) times daily. 06/10/20   Khatri, Hina, PA-C  naproxen (NAPROSYN) 500 MG tablet Take 1 tablet (500 mg total) by mouth 2 (two) times daily. 06/10/20   Khatri, Hina, PA-C  potassium chloride SA (KLOR-CON) 20 MEQ tablet Take 2 tablets (40 mEq total) by mouth daily for 5 days. 03/21/19 12/25/19  Long, Arlyss Repress, MD    Allergies    Patient has no known allergies.  Review of Systems   Review of Systems  Constitutional:  Negative for chills and fever.  HENT:  Negative for sore throat.   Respiratory:  Negative for shortness of breath.   Cardiovascular:  Negative for chest pain.  Gastrointestinal:  Positive for abdominal pain. Negative for nausea and vomiting.  Genitourinary:  Positive for genital sores and penile discharge. Negative for difficulty urinating, dysuria, flank pain, frequency, penile pain, penile swelling, scrotal swelling and testicular pain.  Musculoskeletal:  Negative for back pain, neck pain and neck stiffness.  Skin:  Negative for color change, pallor, rash and wound.  Allergic/Immunologic: Negative for immunocompromised state.  Neurological:  Negative for dizziness, syncope, light-headedness and headaches.  Psychiatric/Behavioral:  Negative for confusion.    Physical Exam Updated  Vital Signs BP 117/82   Pulse 64   Temp 98.2 F (36.8 C) (Oral)   Resp 16   Ht 5\' 11"  (1.803 m)   Wt 86.2 kg   SpO2 100%   BMI 26.50 kg/m   Physical Exam Vitals and nursing note reviewed. Chaperone present: Male nurse tech present as chaperone.  Constitutional:      General: He is not in acute distress.    Appearance: He is not ill-appearing, toxic-appearing or diaphoretic.  HENT:     Head: Normocephalic.  Eyes:     General: No scleral icterus.       Right eye: No discharge.         Left eye: No discharge.  Cardiovascular:     Rate and Rhythm: Normal rate.  Pulmonary:     Effort: Pulmonary effort is normal.  Abdominal:     General: Abdomen is flat. Bowel sounds are normal. There is no distension. There are no signs of injury.     Palpations: Abdomen is soft. There is no mass or pulsatile mass.     Tenderness: There is abdominal tenderness in the suprapubic area. There is no right CVA tenderness, left CVA tenderness, guarding or rebound.     Hernia: There is no hernia in the umbilical area, ventral area, left inguinal area or right inguinal area.  Genitourinary:    Pubic Area: No rash or pubic lice.      Penis: Normal and circumcised. No erythema, tenderness, discharge, swelling or lesions.      Testes: Cremasteric reflex is present.        Right: Mass, tenderness, swelling, testicular hydrocele or varicocele not present. Right testis is descended. Cremasteric reflex is present.         Left: Mass, tenderness, swelling, testicular hydrocele or varicocele not present. Left testis is descended. Cremasteric reflex is present.      Epididymis:     Right: Normal.     Left: Normal.     Tanner stage (genital): 5.     Comments: Small scab noted to ventral aspect of penis shaft.  No chancres or vesicular lesions noted. Musculoskeletal:     Cervical back: Normal range of motion and neck supple.  Lymphadenopathy:     Lower Body: No right inguinal adenopathy. No left inguinal adenopathy.  Skin:    General: Skin is warm and dry.     Coloration: Skin is not cyanotic or jaundiced.  Neurological:     General: No focal deficit present.     Mental Status: He is alert.  Psychiatric:        Behavior: Behavior is cooperative.    ED Results / Procedures / Treatments   Labs (all labs ordered are listed, but only abnormal results are displayed) Labs Reviewed  URINALYSIS, ROUTINE W REFLEX MICROSCOPIC - Abnormal; Notable for the following components:      Result Value    Specific Gravity, Urine >1.030 (*)    Hgb urine dipstick SMALL (*)    All other components within normal limits  URINALYSIS, MICROSCOPIC (REFLEX) - Abnormal; Notable for the following components:   Bacteria, UA RARE (*)    All other components within normal limits  COMPREHENSIVE METABOLIC PANEL - Abnormal; Notable for the following components:   Glucose, Bld 111 (*)    Calcium 8.7 (*)    Total Protein 6.4 (*)    Total Bilirubin 0.1 (*)    All other components within normal limits  URINE CULTURE  CBC WITH  DIFFERENTIAL/PLATELET  LIPASE, BLOOD  HIV ANTIBODY (ROUTINE TESTING W REFLEX)  RPR  GC/CHLAMYDIA PROBE AMP ([place]) NOT AT [facility]    EKG None  Radiology No results found.  Procedures Procedures   Medications Ordered in ED Medications  cefTRIAXone (ROCEPHIN) injection 500 mg (500 mg Intramuscular Given 11/29/20 1425)  lidocaine (PF) (XYLOCAINE) 1 % injection 1 mL (1 mL Other Given 11/29/20 1425)  metroNIDAZOLE (FLAGYL) tablet 2,000 mg (2,000 mg Oral Given 11/29/20 1425)  ondansetron (ZOFRAN-ODT) disintegrating tablet 8 mg (8 mg Oral Given 11/29/20 1425)    ED Course  I have reviewed the triage vital signs and the nursing notes.  Pertinent labs & imaging results that were available during my care of the patient were reviewed by me and considered in my medical decision making (see chart for details).    MDM Rules/Calculators/A&P                          Alert 35 year old male no acute distress, nontoxic-appearing.  Patient presents emergency department with a chief plaint of lower abdominal pain and concern for exposure to STI.  Shared decision making with patient on empiric treatment for gonorrhea, chlamydia, and trichomonas.  Patient elects to receive empiric treatment at this time.  Shared decision-making with patient about 7 day course of Flagyl versus 1 large dose here in emergency department.  Patient elects for 1 large dose of Flagyl.  Patient defers prophylactic HIV  treatment at this time.  Shared decision making with patient, obtaining penile swab for testing of gonorrhea chlamydia versus obtaining sample from urinalysis.  Patient defers penile swab at this time.  Patient given duration to have repeat HIV testing performed in 1 month.  Patient given information to follow-up with [facility] department, urgent care, primary care provider if HIV or syphilis testing is positive.  On physical exam patient has normal active bowel sounds.  Abdomen soft, nondistended, mild tenderness to suprapubic area.  No guarding or rebound tenderness.  Will obtain CMP, CBC, lipase, urinalysis.    CBC is unremarkable. CMP is unremarkable. Lipase within normal limits. Urinalysis shows RBC 6-10, WBC 11-20, bacteria rare, calcium oxalate present, leukocytes negative, nitrite negative.  Suspect RBC, WBC, and rare bacteria seen are secondary to possible STI exposure.  Urine culture pending.  Low suspicion for epididymitis based on patient's physical exam.  Low suspicion for acute appendicitis as patient has no tenderness to right lower quadrant.  Low suspicion for diverticulitis as patient is afebrile, no leukocytosis, no focal tenderness to left lower quadrant.  Patient to follow-up with [facility] department or primary care provider if HIV or syphilis testing is positive.  Patient to follow-up with urgent care or primary care provider if your symptoms do not improve.  Patient given strict return precautions.    Final Clinical Impression(s) / ED Diagnoses Final diagnoses:  Lower abdominal pain  Encounter for assessment of STD exposure    Rx / DC Orders ED Discharge Orders          Ordered    doxycycline (VIBRA-TABS) 100 MG tablet  2 times daily        11/29/20 [address] 11/29/20 2311    Alvira Monday, MD 11/30/20 2212

## 2020-11-29 NOTE — ED Notes (Signed)
Spoke with William Miranda to add on GC chlamydia to urine

## 2020-11-29 NOTE — ED Notes (Signed)
Pt discharged to home. Discharge instructions have been discussed with patient and/or family members. Pt verbally acknowledges understanding d/c instructions, and endorses comprehension to checkout at registration before leaving.  °

## 2020-11-30 LAB — GC/CHLAMYDIA PROBE AMP (~~LOC~~) NOT AT ARMC
Chlamydia: NEGATIVE
Comment: NEGATIVE
Comment: NORMAL
Neisseria Gonorrhea: NEGATIVE

## 2020-11-30 LAB — RPR: RPR Ser Ql: NONREACTIVE

## 2020-12-01 ENCOUNTER — Encounter (HOSPITAL_COMMUNITY): Payer: Self-pay

## 2020-12-01 ENCOUNTER — Other Ambulatory Visit: Payer: Self-pay

## 2020-12-01 ENCOUNTER — Emergency Department (HOSPITAL_COMMUNITY)
Admission: EM | Admit: 2020-12-01 | Discharge: 2020-12-01 | Disposition: A | Discharge status: Home / Self Care | Payer: Self-pay | Attending: Emergency Medicine | Admitting: Emergency Medicine

## 2020-12-01 DIAGNOSIS — Z87891 Personal history of nicotine dependence: Secondary | ICD-10-CM | POA: Insufficient documentation

## 2020-12-01 DIAGNOSIS — H6122 Impacted cerumen, left ear: Secondary | ICD-10-CM | POA: Insufficient documentation

## 2020-12-01 DIAGNOSIS — H9202 Otalgia, left ear: Secondary | ICD-10-CM

## 2020-12-01 LAB — URINE CULTURE: Culture: <10000 — AB

## 2020-12-01 NOTE — ED Provider Notes (Signed)
[facility]-EMERGENCY DEPT Provider Note   CSN: [id number] Arrival date & time: 12/01/20  1221     History Chief Complaint  Patient presents with   Otalgia    William Miranda is a 35 y.o. male who presents to the ED today with complaint of left ear pain that began last night.  Patient reports he went to clean his ears with a Q-tip.  While cleaning his left ear he felt like he got a piece of the Q-tip stuck in his ear as he felt something inside of his ear.  He states that he looked at the Q-tip and had a large piece of wax on it.  He did not notice any missing cotton from the Q-tip.  He states that since that time he has attempted to get what ever is in his ear out.  He states that he has attempted using tweezers as well as an opened paperclip to try to remove whatever is in his ear without success. He now has muffled hearing to the ear. Denies any bleeding from the ear. No other complaints at this time.   The history is provided by the patient and medical records.      Past Medical History:  Diagnosis Date   Bronchitis    ETOH abuse    Polysubstance abuse (HCC)     There are no problems to display for this patient.   Past Surgical History:  Procedure Laterality Date   HAND SURGERY     right   HAND SURGERY         Family History  Family history unknown: Yes    Social History   Tobacco Use   Smoking status: Former    Packs/day: 0.50    Pack years: 0.00    Types: Cigars, Cigarettes   Smokeless tobacco: Never  Vaping Use   Vaping Use: Never used  Substance Use Topics   Alcohol use: Yes    Comment: every other day   Drug use: Yes    Types: Marijuana    Home Medications Prior to Admission medications   Medication Sig Start Date End Date Taking? Authorizing Provider  doxycycline (VIBRA-TABS) 100 MG tablet Take 1 tablet (100 mg total) by mouth 2 (two) times daily for 7 days. 11/29/20 12/06/20  Haskel Schroeder, PA-C  methocarbamol  (ROBAXIN) 500 MG tablet Take 1 tablet (500 mg total) by mouth 2 (two) times daily. 06/10/20   Khatri, Hina, PA-C  naproxen (NAPROSYN) 500 MG tablet Take 1 tablet (500 mg total) by mouth 2 (two) times daily. 06/10/20   Khatri, Hina, PA-C  potassium chloride SA (KLOR-CON) 20 MEQ tablet Take 2 tablets (40 mEq total) by mouth daily for 5 days. 03/21/19 12/25/19  Long, Arlyss Repress, MD    Allergies    Patient has no known allergies.  Review of Systems   Review of Systems  Constitutional:  Negative for chills and fever.  HENT:  Positive for ear pain and hearing loss. Negative for ear discharge.   All other systems reviewed and are negative.  Physical Exam Updated Vital Signs BP (!) 141/109 (BP Location: Left Arm)   Pulse 81   Temp 97.9 F (36.6 C) (Oral)   Resp 18   Ht 5\' 11"  (1.803 m)   Wt 86.2 kg   SpO2 97%   BMI 26.50 kg/m   Physical Exam Vitals and nursing note reviewed.  Constitutional:      Appearance: He is not ill-appearing.  HENT:  Head: Normocephalic and atraumatic.     Right Ear: Tympanic membrane normal.     Ears:     Comments: Large amount of wax covering about 1/2 of the TM. The visualized portion of the TM appears intact without signs of rupture. No EAC erythema or edema. No TTP to external ear.  Eyes:     Conjunctiva/sclera: Conjunctivae normal.  Cardiovascular:     Rate and Rhythm: Normal rate and regular rhythm.  Pulmonary:     Effort: Pulmonary effort is normal.     Breath sounds: Normal breath sounds.  Skin:    General: Skin is warm and dry.     Coloration: Skin is not jaundiced.  Neurological:     Mental Status: He is alert.    ED Results / Procedures / Treatments   Labs (all labs ordered are listed, but only abnormal results are displayed) Labs Reviewed - No data to display  EKG None  Radiology No results found.  Procedures Procedures   Medications Ordered in ED Medications - No data to display  ED Course  I have reviewed the triage  vital signs and the nursing notes.  Pertinent labs & imaging results that were available during my care of the patient were reviewed by me and considered in my medical decision making (see chart for details).    MDM Rules/Calculators/A&P                          35 year old male who presents to the ED today with complaint of left ear pain after putting a Q-tip in his ear last night.  Felt like something was stuck, has tried to get it out without relief.  Now having hearing loss.  On arrival to the ED today vitals are stable.  Patient appears to be in no acute distress.  On my exam patient has a large amount of earwax covering approximately one half of the left TM.  The visualized portion of the TM is clear without any signs of erythema, no rupture appreciated.  There are no signs of external auditory canal erythema or edema to suggest otitis externa.  No other trauma appreciated to the ear.  We will plan for earwax removal by nursing staff and further visualization of the remainder of the TM.   Ear wax removed however procedure discontinued due to pt complaining of dizziness. On my exam pt resting comfortably; no longer having dizziness. Able to fully visualize TM at this time; clear without signs of rupture or infection. Small abrasion to external auditory canal. No FB. Tetanus UTD. Will discharge home at this time with PCP follow up.   This note was prepared using Dragon voice recognition software and may include unintentional dictation errors due to the inherent limitations of voice recognition software.  Final Clinical Impression(s) / ED Diagnoses Final diagnoses:  Otalgia of left ear  Impacted cerumen of left ear    Rx / DC Orders ED Discharge Orders     None        Discharge Instructions      Refrain from placing sharp objects in your ear in the future as this can cause serious damage to your ear drum.  If you feel like you are having a wax buildup you can buy OTC Debrox ear  drops to help with this. It is a more natural way to clean out your ear than q tips.   Follow up with your PCP regarding ED visit  Return to the ED for any new/worsening symptoms       Tanda Rockers, Cordelia Poche 12/01/20 1351    Bethann Berkshire, MD 12/04/20 [phone]

## 2020-12-01 NOTE — Discharge Instructions (Addendum)
Refrain from placing sharp objects in your ear in the future as this can cause serious damage to your ear drum.  If you feel like you are having a wax buildup you can buy OTC Debrox ear drops to help with this. It is a more natural way to clean out your ear than q tips.   Follow up with your PCP regarding ED visit  Return to the ED for any new/worsening symptoms

## 2020-12-01 NOTE — ED Triage Notes (Signed)
Patient c/o left ear pain since yesterday. Patient states he has ear wax in his ear. Patient states he poured H2O2 in his ear, but is have pain and pressure especially when yawning.

## 2021-01-06 ENCOUNTER — Emergency Department (HOSPITAL_BASED_OUTPATIENT_CLINIC_OR_DEPARTMENT_OTHER): Payer: No Typology Code available for payment source

## 2021-01-06 ENCOUNTER — Emergency Department (HOSPITAL_BASED_OUTPATIENT_CLINIC_OR_DEPARTMENT_OTHER)
Admission: EM | Admit: 2021-01-06 | Discharge: 2021-01-06 | Disposition: A | Discharge status: Home / Self Care | Payer: No Typology Code available for payment source | Attending: Emergency Medicine | Admitting: Emergency Medicine

## 2021-01-06 ENCOUNTER — Encounter (HOSPITAL_BASED_OUTPATIENT_CLINIC_OR_DEPARTMENT_OTHER): Payer: Self-pay | Admitting: Emergency Medicine

## 2021-01-06 ENCOUNTER — Other Ambulatory Visit: Payer: Self-pay

## 2021-01-06 ENCOUNTER — Emergency Department (HOSPITAL_BASED_OUTPATIENT_CLINIC_OR_DEPARTMENT_OTHER): Payer: No Typology Code available for payment source | Admitting: Radiology

## 2021-01-06 DIAGNOSIS — M79602 Pain in left arm: Secondary | ICD-10-CM | POA: Insufficient documentation

## 2021-01-06 DIAGNOSIS — M549 Dorsalgia, unspecified: Secondary | ICD-10-CM | POA: Diagnosis not present

## 2021-01-06 DIAGNOSIS — Z87891 Personal history of nicotine dependence: Secondary | ICD-10-CM | POA: Insufficient documentation

## 2021-01-06 DIAGNOSIS — Y92410 Unspecified street and highway as the place of occurrence of the external cause: Secondary | ICD-10-CM | POA: Insufficient documentation

## 2021-01-06 DIAGNOSIS — M542 Cervicalgia: Secondary | ICD-10-CM | POA: Insufficient documentation

## 2021-01-06 DIAGNOSIS — R202 Paresthesia of skin: Secondary | ICD-10-CM | POA: Diagnosis not present

## 2021-01-06 MED ORDER — ACETAMINOPHEN 500 MG PO TABS
1000.0000 mg | ORAL_TABLET | Freq: Once | ORAL | Status: DC
  Filled 2021-01-06: qty 2

## 2021-01-06 MED ORDER — KETOROLAC TROMETHAMINE 30 MG/ML IJ SOLN
30.0000 mg | Freq: Once | INTRAMUSCULAR | Status: AC
  Administered 2021-01-06: 30 mg via INTRAMUSCULAR
  Filled 2021-01-06: qty 1
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
## 2021-01-06 NOTE — ED Notes (Signed)
Patient transported to X-ray 

## 2021-01-06 NOTE — ED Notes (Signed)
Pt verbalizes understanding of discharge instructions. Opportunity for questioning and answers were provided. William Miranda removed by staff, pt discharged from ED to home.  
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
## 2021-01-06 NOTE — Discharge Instructions (Addendum)
Please keep your cervical collar on until cleared by a physician in clinic. Tylenol and Ibuprofen for pain control

## 2021-03-04 ENCOUNTER — Emergency Department (HOSPITAL_BASED_OUTPATIENT_CLINIC_OR_DEPARTMENT_OTHER)
Admission: EM | Admit: 2021-03-04 | Discharge: 2021-03-04 | Disposition: A | Discharge status: Home / Self Care | Payer: Self-pay | Attending: Emergency Medicine | Admitting: Emergency Medicine

## 2021-03-04 ENCOUNTER — Encounter (HOSPITAL_BASED_OUTPATIENT_CLINIC_OR_DEPARTMENT_OTHER): Payer: Self-pay | Admitting: Emergency Medicine

## 2021-03-04 ENCOUNTER — Other Ambulatory Visit: Payer: Self-pay

## 2021-03-04 DIAGNOSIS — Z87891 Personal history of nicotine dependence: Secondary | ICD-10-CM | POA: Insufficient documentation

## 2021-03-04 DIAGNOSIS — R300 Dysuria: Secondary | ICD-10-CM | POA: Insufficient documentation

## 2021-03-04 DIAGNOSIS — Z202 Contact with and (suspected) exposure to infections with a predominantly sexual mode of transmission: Secondary | ICD-10-CM | POA: Insufficient documentation

## 2021-03-04 LAB — URINALYSIS, ROUTINE W REFLEX MICROSCOPIC
Bilirubin Urine: NEGATIVE
Glucose, UA: NEGATIVE mg/dL
Ketones, ur: NEGATIVE mg/dL
Nitrite: NEGATIVE
Protein, ur: NEGATIVE mg/dL
Specific Gravity, Urine: 1.029 (ref 1.005–1.030)
pH: 5 (ref 5.0–8.0)

## 2021-03-04 LAB — HIV ANTIBODY (ROUTINE TESTING W REFLEX): HIV Screen 4th Generation wRfx: NONREACTIVE

## 2021-03-04 MED ORDER — LIDOCAINE HCL (PF) 1 % IJ SOLN
INTRAMUSCULAR | Status: AC
  Administered 2021-03-04: 5 mL
  Filled 2021-03-04: qty 5

## 2021-03-04 MED ORDER — DOXYCYCLINE HYCLATE 100 MG PO CAPS: 100.0000 mg | ORAL_CAPSULE | Freq: Two times a day (BID) | ORAL | 0 refills | Status: AC

## 2021-03-04 MED ORDER — DOXYCYCLINE HYCLATE 100 MG PO TABS
100.0000 mg | ORAL_TABLET | Freq: Once | ORAL | Status: AC
  Administered 2021-03-04: 100 mg via ORAL
  Filled 2021-03-04: qty 1

## 2021-03-04 MED ORDER — CEFTRIAXONE SODIUM 500 MG IJ SOLR
500.0000 mg | Freq: Once | INTRAMUSCULAR | Status: AC
  Administered 2021-03-04: 500 mg via INTRAMUSCULAR
  Filled 2021-03-04: qty 500

## 2021-03-04 NOTE — Discharge Instructions (Addendum)
Follow-up your gonorrhea and Chlamydia test on your MyChart.  Take antibiotics as prescribed.  Once you complete antibiotics you will be fully treated for gonorrhea and chlamydia.

## 2021-03-04 NOTE — ED Notes (Signed)
Pt verbalizes understanding of discharge instructions. Opportunity for questioning and answers were provided. Armand removed by staff, pt discharged from ED to home. Educated to pick up Rx.  

## 2021-03-04 NOTE — ED Provider Notes (Signed)
[facility] EMERGENCY DEPT Provider Note   CSN: [id number] Arrival date & time: 03/04/21  1624     History Chief Complaint  Patient presents with   Dysuria    William Miranda is a 35 y.o. male.  Patient wants to be tested for STDs.  Denies any discharge.  Denies any penile lesions.  Has had some pain with urination.  The history is provided by the patient.  Dysuria Presenting symptoms: dysuria   Context: spontaneously   Relieved by:  Nothing Worsened by:  Nothing Associated symptoms: no abdominal pain, no diarrhea, no fever, no flank pain, no genital itching, no genital lesions, no genital rash, no groin pain, no hematuria, no nausea, no penile redness, no penile swelling, no priapism, no scrotal swelling, no urinary frequency, no urinary hesitation, no urinary incontinence, no urinary retention and no vomiting       Past Medical History:  Diagnosis Date   Bronchitis    ETOH abuse    Polysubstance abuse (HCC)     There are no problems to display for this patient.   Past Surgical History:  Procedure Laterality Date   HAND SURGERY     right   HAND SURGERY         Family History  Family history unknown: Yes    Social History   Tobacco Use   Smoking status: Former    Packs/day: 0.50    Types: Cigars, Cigarettes   Smokeless tobacco: Never  Vaping Use   Vaping Use: Never used  Substance Use Topics   Alcohol use: Yes    Comment: every other day   Drug use: Yes    Types: Marijuana    Home Medications Prior to Admission medications   Medication Sig Start Date End Date Taking? Authorizing Provider  doxycycline (VIBRAMYCIN) 100 MG capsule Take 1 capsule (100 mg total) by mouth 2 (two) times daily for 7 days. 03/04/21 03/11/21 Yes Tramar Brueckner, DO  methocarbamol (ROBAXIN) 500 MG tablet Take 1 tablet (500 mg total) by mouth 2 (two) times daily. 06/10/20   Khatri, Hina, PA-C  naproxen (NAPROSYN) 500 MG tablet Take 1 tablet (500 mg total) by  mouth 2 (two) times daily. 06/10/20   Khatri, Hina, PA-C  potassium chloride SA (KLOR-CON) 20 MEQ tablet Take 2 tablets (40 mEq total) by mouth daily for 5 days. 03/21/19 12/25/19  Long, Arlyss Repress, MD    Allergies    Patient has no known allergies.  Review of Systems   Review of Systems  Constitutional:  Negative for fever.  Gastrointestinal:  Negative for abdominal pain, diarrhea, nausea and vomiting.  Genitourinary:  Positive for dysuria. Negative for bladder incontinence, flank pain, frequency, hematuria, hesitancy, penile swelling and scrotal swelling.   Physical Exam Updated Vital Signs BP (!) 130/97   Pulse 87   Temp 98.6 F (37 C)   Resp 16   Ht 5\' 11"  (1.803 m)   Wt 88.3 kg   SpO2 96%   BMI 27.15 kg/m   Physical Exam Constitutional:      General: He is not in acute distress.    Appearance: He is not ill-appearing.  Abdominal:     General: Abdomen is flat.     Tenderness: There is no abdominal tenderness.  Genitourinary:    Comments: Patient did not want GU exam Neurological:     Mental Status: He is alert.    ED Results / Procedures / Treatments   Labs (all labs ordered are listed, but  only abnormal results are displayed) Labs Reviewed  URINALYSIS, ROUTINE W REFLEX MICROSCOPIC - Abnormal; Notable for the following components:      Result Value   Hgb urine dipstick SMALL (*)    Leukocytes,Ua TRACE (*)    Bacteria, UA FEW (*)    All other components within normal limits  URINE CULTURE  HIV ANTIBODY (ROUTINE TESTING W REFLEX)  RPR  GC/CHLAMYDIA PROBE AMP ([place]) NOT AT [facility]    EKG None  Radiology No results found.  Procedures Procedures   Medications Ordered in ED Medications  cefTRIAXone (ROCEPHIN) injection 500 mg (500 mg Intramuscular Given 03/04/21 1916)  doxycycline (VIBRA-TABS) tablet 100 mg (100 mg Oral Given 03/04/21 1916)  lidocaine (PF) (XYLOCAINE) 1 % injection (5 mLs  Given 03/04/21 1917)    ED Course  I have reviewed the triage  vital signs and the nursing notes.  Pertinent labs & imaging results that were available during my care of the patient were reviewed by me and considered in my medical decision making (see chart for details).    MDM Rules/Calculators/A&P                           [facility] is here for STD concern.  Urinalysis overall does not look infectious.  Vital signs are normal.  Did not want GU exam.  He has been having some mild pain with urination.  Concern for STD and wants treatment for gonorrhea and chlamydia.  Tested for HIV and syphilis.  Given empiric treatment.  Discharged in good condition.  Understands return precautions.  Educated about safe sex practices and need for partners to be treated if positive.  This chart was dictated using voice recognition software.  Despite best efforts to proofread,  errors can occur which can change the documentation meaning.   Final Clinical Impression(s) / ED Diagnoses Final diagnoses:  Possible exposure to STD    Rx / DC Orders ED Discharge Orders          Ordered    doxycycline (VIBRAMYCIN) 100 MG capsule  2 times daily        03/04/21 1951             Virgina Norfolk, DO 03/04/21 2003

## 2021-03-04 NOTE — ED Triage Notes (Signed)
Reports dysuria x 3 days , denies penile discharge, abdominal pain. Here for STd check

## 2021-03-05 LAB — RPR: RPR Ser Ql: NONREACTIVE

## 2021-03-06 LAB — URINE CULTURE: Culture: NO GROWTH

## 2021-03-06 LAB — GC/CHLAMYDIA PROBE AMP (~~LOC~~) NOT AT ARMC
Chlamydia: NEGATIVE
Comment: NEGATIVE
Comment: NORMAL
Neisseria Gonorrhea: NEGATIVE

## 2021-03-11 ENCOUNTER — Other Ambulatory Visit: Payer: Self-pay

## 2021-03-11 ENCOUNTER — Emergency Department (HOSPITAL_COMMUNITY)
Admission: EM | Admit: 2021-03-11 | Discharge: 2021-03-11 | Disposition: A | Discharge status: Home / Self Care | Payer: No Typology Code available for payment source | Attending: Emergency Medicine | Admitting: Emergency Medicine

## 2021-03-11 ENCOUNTER — Emergency Department (HOSPITAL_COMMUNITY): Payer: No Typology Code available for payment source

## 2021-03-11 DIAGNOSIS — M25512 Pain in left shoulder: Secondary | ICD-10-CM | POA: Diagnosis not present

## 2021-03-11 DIAGNOSIS — Z87891 Personal history of nicotine dependence: Secondary | ICD-10-CM | POA: Insufficient documentation

## 2021-03-11 MED ORDER — MELOXICAM 15 MG PO TABS
15.0000 mg | ORAL_TABLET | Freq: Once | ORAL | Status: AC
  Administered 2021-03-11: 15 mg via ORAL
  Filled 2021-03-11: qty 1

## 2021-03-11 MED ORDER — MELOXICAM 7.5 MG PO TABS: 15.0000 mg | ORAL_TABLET | Freq: Every day | ORAL | 0 refills | Status: DC

## 2021-03-11 NOTE — Discharge Instructions (Addendum)
Take the prescribed medication as directed.  Can wear sling for now if this is more comfortable. Follow-up with orthopedics-- call to get appt scheduled. Return to the ED for new or worsening symptoms.
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
## 2021-03-11 NOTE — ED Notes (Signed)
Pt received discharge papers and expressed understanding of instructions.
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
## 2021-08-10 ENCOUNTER — Encounter (HOSPITAL_BASED_OUTPATIENT_CLINIC_OR_DEPARTMENT_OTHER): Payer: Self-pay

## 2021-08-10 ENCOUNTER — Emergency Department (HOSPITAL_BASED_OUTPATIENT_CLINIC_OR_DEPARTMENT_OTHER)
Admission: EM | Admit: 2021-08-10 | Discharge: 2021-08-10 | Disposition: A | Discharge status: Home / Self Care | Payer: Self-pay | Attending: Emergency Medicine | Admitting: Emergency Medicine

## 2021-08-10 ENCOUNTER — Other Ambulatory Visit: Payer: Self-pay

## 2021-08-10 DIAGNOSIS — R300 Dysuria: Secondary | ICD-10-CM | POA: Insufficient documentation

## 2021-08-10 LAB — URINALYSIS, ROUTINE W REFLEX MICROSCOPIC
Bilirubin Urine: NEGATIVE
Glucose, UA: NEGATIVE mg/dL
Hgb urine dipstick: NEGATIVE
Nitrite: NEGATIVE
Protein, ur: 30 mg/dL — AB
Specific Gravity, Urine: 1.038 — ABNORMAL HIGH (ref 1.005–1.030)
pH: 6 (ref 5.0–8.0)

## 2021-08-10 NOTE — ED Triage Notes (Signed)
Pt reports dysuria with urgency.  States urine is cloudy and dark.   ?Started 5 days ago ?

## 2021-08-10 NOTE — Discharge Instructions (Signed)
Follow-up with your primary care doctor.  Recommend following safe sex practices.  Check MyChart for results regarding your gonorrhea and Chlamydia testing.  If positive you will need additional treatment. ?

## 2021-08-10 NOTE — ED Notes (Signed)
Lab notified of GC/Chlamydia add on to UA  ?

## 2021-08-10 NOTE — ED Notes (Signed)
MD made aware that pt left before receiving discharge paperwork or updated VS.  ? ?Per MD pt was made aware of results and can be discharged out of system  ?

## 2021-08-11 LAB — GC/CHLAMYDIA PROBE AMP (~~LOC~~) NOT AT ARMC
Chlamydia: NEGATIVE
Comment: NEGATIVE
Comment: NORMAL
Neisseria Gonorrhea: NEGATIVE

## 2021-08-11 NOTE — ED Provider Notes (Signed)
?[facility] EMERGENCY DEPT ?Provider Note ? ? ?CSN: [id number] ?Arrival date & time: 08/10/21  1906 ? ?  ? ?History ? ?Chief Complaint  ?Patient presents with  ? Dysuria  ? ? ?William Miranda is a 36 y.o. male.  Presenting to the ER with concern for dysuria.  Reports that he has had some mild discomfort when urinating over the last few days.  Also seems like his urine is slightly more dark than normal.  He otherwise has been feeling fine.  Noted slight discomfort on the shaft of his penis but has not noted any swelling, sores or lesions.  Reports that he is sexually active, does not consistently use protection, is not aware of any STD exposures. ? ?HPI ? ?  ? ?Home Medications ?Prior to Admission medications   ?Medication Sig Start Date End Date Taking? Authorizing Provider  ?meloxicam (MOBIC) 7.5 MG tablet Take 2 tablets (15 mg total) by mouth daily. 03/11/21   Garlon Hatchet, PA-C  ?methocarbamol (ROBAXIN) 500 MG tablet Take 1 tablet (500 mg total) by mouth 2 (two) times daily. 06/10/20   Khatri, Hina, PA-C  ?naproxen (NAPROSYN) 500 MG tablet Take 1 tablet (500 mg total) by mouth 2 (two) times daily. 06/10/20   Khatri, Hina, PA-C  ?potassium chloride SA (KLOR-CON) 20 MEQ tablet Take 2 tablets (40 mEq total) by mouth daily for 5 days. 03/21/19 12/25/19  Long, Arlyss Repress, MD  ?   ? ?Allergies    ?Patient has no known allergies.   ? ?Review of Systems   ?Review of Systems  ?Genitourinary:  Positive for dysuria.  ?All other systems reviewed and are negative. ? ?Physical Exam ?Updated Vital Signs ?BP (!) 140/121 (BP Location: Right Arm)   Pulse 84   Temp (!) 97.3 ?F (36.3 ?C)   Resp 18   Ht 5\' 11"  (1.803 m)   Wt 93 kg   SpO2 98%   BMI 28.59 kg/m?  ?Physical Exam ?Vitals and nursing note reviewed. Exam conducted with a chaperone present.  ?Constitutional:   ?   General: He is not in acute distress. ?   Appearance: He is well-developed.  ?HENT:  ?   Head: Normocephalic and atraumatic.  ?Eyes:  ?    Conjunctiva/sclera: Conjunctivae normal.  ?Cardiovascular:  ?   Rate and Rhythm: Normal rate and regular rhythm.  ?   Heart sounds: No murmur heard. ?Pulmonary:  ?   Effort: Pulmonary effort is normal. No respiratory distress.  ?   Breath sounds: Normal breath sounds.  ?Abdominal:  ?   Palpations: Abdomen is soft.  ?   Tenderness: There is no abdominal tenderness.  ?Genitourinary: ?   Comments: Normal appearing penis and testicles, no sores/lesions, no discharge at tip of penis noted ?Tech chaperon ?Musculoskeletal:     ?   General: No swelling.  ?   Cervical back: Neck supple.  ?Skin: ?   General: Skin is warm and dry.  ?   Capillary Refill: Capillary refill takes less than 2 seconds.  ?Neurological:  ?   Mental Status: He is alert.  ?Psychiatric:     ?   Mood and Affect: Mood normal.  ? ? ?ED Results / Procedures / Treatments   ?Labs ?(all labs ordered are listed, but only abnormal results are displayed) ?Labs Reviewed  ?URINALYSIS, ROUTINE W REFLEX MICROSCOPIC - Abnormal; Notable for the following components:  ?    Result Value  ? Specific Gravity, Urine 1.038 (*)   ? Ketones,  ur TRACE (*)   ? Protein, ur 30 (*)   ? Leukocytes,Ua SMALL (*)   ? All other components within normal limits  ?GC/CHLAMYDIA PROBE AMP ([place]) NOT AT [facility]  ? ? ?EKG ?None ? ?Radiology ?No results found. ? ?Procedures ?Procedures  ? ? ?Medications Ordered in ED ?Medications - No data to display ? ?ED Course/ Medical Decision Making/ A&P ?  ?                        ?Medical Decision Making ?Amount and/or Complexity of Data Reviewed ?Labs: ordered. ? ? ?36 year old gentleman presenting to the emergency room with concern for dysuria.  UA is negative for infection.  He is sexually active but was not aware of any specific STD exposures and he reported that he recently completed STD testing which was all negative.  Will send gonorrhea chlamydia, patient declines HIV, syphilis testing.  Given the reported negative STD screening recently,  minimal symptoms at present, will defer empiric treatment at present.  Advised patient to recheck MyChart and if his test come back positive that he will need additional treatment.  Reviewed return precautions and discharge. ? ?After the discussed management above, the patient was determined to be safe for discharge.  The patient was in agreement with this plan and all questions regarding their care were answered.  ED return precautions were discussed and the patient will return to the ED with any significant worsening of condition. ? ? ? ? ? ? ? ? ?Final Clinical Impression(s) / ED Diagnoses ?Final diagnoses:  ?Dysuria  ? ? ?Rx / DC Orders ?ED Discharge Orders   ? ? None  ? ?  ? ? ?  ?Milagros Loll, MD ?08/12/21 1422 ? ?

## 2021-12-31 ENCOUNTER — Emergency Department (HOSPITAL_BASED_OUTPATIENT_CLINIC_OR_DEPARTMENT_OTHER)
Admission: EM | Admit: 2021-12-31 | Discharge: 2021-12-31 | Disposition: A | Discharge status: Home / Self Care | Payer: Self-pay | Attending: Emergency Medicine | Admitting: Emergency Medicine

## 2021-12-31 ENCOUNTER — Encounter (HOSPITAL_BASED_OUTPATIENT_CLINIC_OR_DEPARTMENT_OTHER): Payer: Self-pay | Admitting: Emergency Medicine

## 2021-12-31 DIAGNOSIS — R1032 Left lower quadrant pain: Secondary | ICD-10-CM | POA: Insufficient documentation

## 2021-12-31 DIAGNOSIS — R369 Urethral discharge, unspecified: Secondary | ICD-10-CM | POA: Insufficient documentation

## 2021-12-31 DIAGNOSIS — R1031 Right lower quadrant pain: Secondary | ICD-10-CM | POA: Insufficient documentation

## 2021-12-31 DIAGNOSIS — R1030 Lower abdominal pain, unspecified: Secondary | ICD-10-CM

## 2021-12-31 LAB — COMPREHENSIVE METABOLIC PANEL
ALT: 20 U/L (ref 0–44)
AST: 18 U/L (ref 15–41)
Albumin: 3.8 g/dL (ref 3.5–5.0)
Alkaline Phosphatase: 105 U/L (ref 38–126)
Anion gap: 5 (ref 5–15)
BUN: 18 mg/dL (ref 6–20)
CO2: 23 mmol/L (ref 22–32)
Calcium: 9 mg/dL (ref 8.9–10.3)
Chloride: 109 mmol/L (ref 98–111)
Creatinine, Ser: 0.96 mg/dL (ref 0.61–1.24)
GFR, Estimated: >60 mL/min (ref >60)
Glucose, Bld: 95 mg/dL (ref 70–99)
Potassium: 3.7 mmol/L (ref 3.5–5.1)
Sodium: 137 mmol/L (ref 135–145)
Total Bilirubin: 0.5 mg/dL (ref 0.3–1.2)
Total Protein: 6.8 g/dL (ref 6.5–8.1)

## 2021-12-31 LAB — LIPASE, BLOOD: Lipase: 21 U/L (ref 11–51)

## 2021-12-31 LAB — CBC WITH DIFFERENTIAL/PLATELET
Abs Immature Granulocytes: 0.04 10*3/uL (ref 0.00–0.07)
Basophils Absolute: 0.1 10*3/uL (ref 0.0–0.1)
Basophils Relative: 1 %
Eosinophils Absolute: 0.3 10*3/uL (ref 0.0–0.5)
Eosinophils Relative: 4 %
HCT: 42.3 % (ref 39.0–52.0)
Hemoglobin: 14.4 g/dL (ref 13.0–17.0)
Immature Granulocytes: 1 %
Lymphocytes Relative: 32 %
Lymphs Abs: 2.8 10*3/uL (ref 0.7–4.0)
MCH: 30 pg (ref 26.0–34.0)
MCHC: 34 g/dL (ref 30.0–36.0)
MCV: 88.1 fL (ref 80.0–100.0)
Monocytes Absolute: 0.8 10*3/uL (ref 0.1–1.0)
Monocytes Relative: 10 %
Neutro Abs: 4.6 10*3/uL (ref 1.7–7.7)
Neutrophils Relative %: 52 %
Platelets: 237 10*3/uL (ref 150–400)
RBC: 4.8 MIL/uL (ref 4.22–5.81)
RDW: 14 % (ref 11.5–15.5)
WBC: 8.6 10*3/uL (ref 4.0–10.5)
nRBC: 0 % (ref 0.0–0.2)

## 2021-12-31 LAB — URINALYSIS, ROUTINE W REFLEX MICROSCOPIC
Bilirubin Urine: NEGATIVE
Glucose, UA: NEGATIVE mg/dL
Ketones, ur: 15 mg/dL — AB
Nitrite: NEGATIVE
Protein, ur: NEGATIVE mg/dL
Specific Gravity, Urine: 1.025 (ref 1.005–1.030)
pH: 5.5 (ref 5.0–8.0)

## 2021-12-31 LAB — URINALYSIS, MICROSCOPIC (REFLEX)

## 2021-12-31 MED ORDER — DOXYCYCLINE HYCLATE 100 MG PO TABS: 100.0000 mg | ORAL_TABLET | Freq: Two times a day (BID) | ORAL | 0 refills | Status: AC

## 2021-12-31 MED ORDER — CEFTRIAXONE SODIUM 500 MG IJ SOLR
500.0000 mg | Freq: Once | INTRAMUSCULAR | Status: AC
  Administered 2021-12-31: 500 mg via INTRAMUSCULAR
  Filled 2021-12-31: qty 500

## 2021-12-31 MED ORDER — LIDOCAINE HCL (PF) 1 % IJ SOLN
1.0000 mL | Freq: Once | INTRAMUSCULAR | Status: AC
  Administered 2021-12-31: 1.2 mL
  Filled 2021-12-31: qty 5

## 2021-12-31 NOTE — Discharge Instructions (Signed)
Today you have been tested for gonorrhea and chlamydia.  The test to determine if you have these will take a few days. They will only call you if your tests come back positive, no news is good news. In the result that your tests are positive you have already been treated.  As part of your treatment please take the antibiotic, Doxycycline, every 12 hours until gone. A side effect of this medication includes hypersensitivity to the suns rays - please take measures to protect your skin from the sun while taking this medication.  You also have tests pending for HIV and syphilis.  You should receive a call if these results are positive.  On your discharge information there is also instructions on how to sign up for my chart.  Please complete this so that you can follow the results on your own also.  If positive for HIV or syphilis please go to [facility] department, your primary care doctor, or urgent care to start treatment.  Please do not have any sexual activity for the next two weeks.  After that please make sure that you always use a condom every time you have sex.  If you have any new or concerning symptoms please seek additional medical care and evaluation.

## 2021-12-31 NOTE — ED Triage Notes (Signed)
Pt reports bilateral lower abd pain, sore throat, and discharge for the past 3 days. Some diarrhea, but n/v. No dysuria, but urine has a strange odor.

## 2021-12-31 NOTE — ED Provider Notes (Signed)
[facility] EMERGENCY DEPARTMENT Provider Note   CSN: [id number] Arrival date & time: 12/31/21  1403     History  Chief Complaint  Patient presents with   Abdominal Pain    William Miranda is a 36 y.o. male with history of EtOH use, polysubstance use.  Presents to the emergency department complaint of lower abdominal pain and penile discharge.  Patient reports that he has been having penile discharge intermittently over the last 2 weeks.  Patient describes discharge as "milky clear."  Patient states that he has had frequent unprotected sex with multiple male partners.  Patient denies any dysuria, hematuria, urinary urgency, swelling or tenderness genitals, genital sores or lesions.  Patient reports that he has had lower abdominal pain.  Pain is located to bilateral lower quadrants.  Pain has been intermittent over the last week.  Patient denies any aggravating or alleviating factors.  Patient denies any pain at present.  Patient does endorse associated nausea.  Denies any fever, chills, vomiting, constipation, diarrhea, blood in stool, melena.  Additionally patient reports that he has had a sore throat over the last 3 days.  Patient denies any trouble swallowing, neck stiffness, potato voice, drooling, trismus.  Patient denies any recent alcohol use.  Denies any illicit drug use or abdominal surgery.   Abdominal Pain Associated symptoms: nausea and sore throat   Associated symptoms: no chest pain, no chills, no constipation, no cough, no diarrhea, no dysuria, no fever, no shortness of breath and no vomiting        Home Medications Prior to Admission medications   Medication Sig Start Date End Date Taking? Authorizing Provider  meloxicam (MOBIC) 7.5 MG tablet Take 2 tablets (15 mg total) by mouth daily. 03/11/21   Garlon Hatchet, PA-C  methocarbamol (ROBAXIN) 500 MG tablet Take 1 tablet (500 mg total) by mouth 2 (two) times daily. 06/10/20   Khatri, Hina, PA-C   naproxen (NAPROSYN) 500 MG tablet Take 1 tablet (500 mg total) by mouth 2 (two) times daily. 06/10/20   Khatri, Hina, PA-C  potassium chloride SA (KLOR-CON) 20 MEQ tablet Take 2 tablets (40 mEq total) by mouth daily for 5 days. 03/21/19 12/25/19  Long, Arlyss Repress, MD      Allergies    Patient has no known allergies.    Review of Systems   Review of Systems  Constitutional:  Negative for chills and fever.  HENT:  Positive for sore throat. Negative for congestion, drooling, facial swelling, rhinorrhea, trouble swallowing and voice change.   Eyes:  Negative for visual disturbance.  Respiratory:  Negative for cough and shortness of breath.   Cardiovascular:  Negative for chest pain.  Gastrointestinal:  Positive for abdominal pain and nausea. Negative for abdominal distention, anal bleeding, blood in stool, constipation, diarrhea, rectal pain and vomiting.  Genitourinary:  Positive for penile discharge. Negative for difficulty urinating, dysuria, flank pain, frequency, penile pain, penile swelling, scrotal swelling, testicular pain and urgency.  Musculoskeletal:  Negative for back pain and neck pain.  Skin:  Negative for color change and rash.  Neurological:  Negative for dizziness, syncope, light-headedness and headaches.  Psychiatric/Behavioral:  Negative for confusion.     Physical Exam Updated Vital Signs BP (!) 134/92 (BP Location: Right Arm)   Pulse 91   Temp 98.6 F (37 C) (Oral)   Resp 16   SpO2 98%  Physical Exam Vitals and nursing note reviewed. Exam conducted with a chaperone present (Male RN present as chaperone).  Constitutional:  General: He is not in acute distress.    Appearance: He is not ill-appearing, toxic-appearing or diaphoretic.  HENT:     Head: Normocephalic.     Mouth/Throat:     Lips: Pink. No lesions.     Mouth: Mucous membranes are moist.     Tongue: No lesions. Tongue does not deviate from midline.     Palate: No mass and lesions.     Pharynx:  Oropharynx is clear. Uvula midline. No pharyngeal swelling, oropharyngeal exudate, posterior oropharyngeal erythema or uvula swelling.     Tonsils: Tonsillar exudate present. No tonsillar abscesses. 1+ on the right. 1+ on the left.     Comments: And resolved screws without difficulty.  No swelling to floor of mouth Eyes:     General: No scleral icterus.       Right eye: No discharge.        Left eye: No discharge.  Neck:     Comments: No swelling to submandibular space Cardiovascular:     Rate and Rhythm: Normal rate.  Pulmonary:     Effort: Pulmonary effort is normal.  Abdominal:     General: Abdomen is flat. There is no distension. There are no signs of injury.     Palpations: Abdomen is soft. There is no mass or pulsatile mass.     Tenderness: There is no abdominal tenderness. There is no guarding or rebound.     Hernia: There is no hernia in the left inguinal area or right inguinal area.  Genitourinary:    Pubic Area: No rash or pubic lice.      Penis: Circumcised. No hypospadias, erythema, tenderness, discharge, swelling or lesions.      Testes: Normal. Cremasteric reflex is present.        Right: Mass, tenderness, swelling, testicular hydrocele or varicocele not present. Right testis is descended. Cremasteric reflex is present.         Left: Mass, tenderness, swelling, testicular hydrocele or varicocele not present. Left testis is descended. Cremasteric reflex is present.      Epididymis:     Right: Not inflamed or enlarged. No mass or tenderness.     Left: Not inflamed or enlarged. No mass or tenderness.     Tanner stage (genital): 5.  Musculoskeletal:     Cervical back: Normal range of motion and neck supple. No edema, erythema, signs of trauma, rigidity, torticollis or crepitus. No pain with movement, spinous process tenderness or muscular tenderness. Normal range of motion.  Lymphadenopathy:     Cervical: No cervical adenopathy.     Lower Body: No right inguinal adenopathy.  No left inguinal adenopathy.  Skin:    General: Skin is warm and dry.  Neurological:     General: No focal deficit present.     Mental Status: He is alert.  Psychiatric:        Behavior: Behavior is cooperative.     ED Results / Procedures / Treatments   Labs (all labs ordered are listed, but only abnormal results are displayed) Labs Reviewed  URINALYSIS, ROUTINE W REFLEX MICROSCOPIC - Abnormal; Notable for the following components:      Result Value   Hgb urine dipstick MODERATE (*)    Ketones, ur 15 (*)    Leukocytes,Ua TRACE (*)    All other components within normal limits  URINALYSIS, MICROSCOPIC (REFLEX) - Abnormal; Notable for the following components:   Bacteria, UA FEW (*)    All other components within normal limits  EKG None  Radiology No results found.  Procedures Procedures    Medications Ordered in ED Medications - No data to display  ED Course/ Medical Decision Making/ A&P                           Medical Decision Making Amount and/or Complexity of Data Reviewed Labs: ordered.  Risk Prescription drug management.   Alert 36 year old male in no acute distress, nontoxic-appearing.  Presents to the ED with a chief complaint of penile discharge and abdominal pain.  Information obtained from patient.  I reviewed patient's past medical records including previous prior notes, labs, and imaging.  Patient has medical history as outlined in HPI which complicates care.  Due to reports of penile discharge will test patient for gonorrhea/chlamydia, HIV and syphilis.  Will treat empirically for gonorrhea and chlamydia with ceftriaxone and doxycycline.  Due to reports of abdominal pain will obtain CMP, CBC, and lipase.  Abdominal exam is reassuring with abdomen soft, nondistended, and nontender.  Suspicion for acute intra-abdominal abnormality at this time.  Patient reports sore throat.  Patient has no trismus, potato voice, swelling to submandibular space,  swelling to floor of mouth.  Range of motion is normal.  No tonsillar erythema/swelling or erythema/swelling to oropharynx.  Low suspicion for strep pharyngitis at this time.  I personally viewed interpret patient's lab results.  Pertinent findings include: -CMP, CBC, and lipase are unremarkable -UA shows bacteria few, WBC 11-20, RBC 11-20, epithelial 6-10, leukocyte trace, nitrite negative.  Low suspicion for UTI at this time.  On serial reexamination abdomen soft, nondistended, nontender with no guarding or rebound tenderness.  Low suspicion for acute intra-abdominal abnormality at this time.  Due to reports of penile discharge patient was empirically treated for gonorrhea and chlamydia.  Patient received ceftriaxone in the emergency department will be prescribed 7 day course of doxycycline.  Information on receiving treatment for positive test results was discussed with patient.  Based on patient's chief complaint, I considered admission might be necessary, however after reassuring ED workup feel patient is reasonable for discharge.  Discussed results, findings, treatment and follow up. Patient advised of return precautions. Patient verbalized understanding and agreed with plan.  Portions of this note were generated with Scientist, clinical (histocompatibility and immunogenetics). Dictation errors may occur despite best attempts at proofreading.         Final Clinical Impression(s) / ED Diagnoses Final diagnoses:  Penile discharge  Lower abdominal pain    Rx / DC Orders ED Discharge Orders          Ordered    doxycycline (VIBRA-TABS) 100 MG tablet  2 times daily        12/31/21 1728              Haskel Schroeder, PA-C 12/31/21 1731    Derwood Kaplan, MD 12/31/21 2002

## 2022-01-01 LAB — GC/CHLAMYDIA PROBE AMP (~~LOC~~) NOT AT ARMC
Chlamydia: NEGATIVE
Comment: NEGATIVE
Comment: NORMAL
Neisseria Gonorrhea: POSITIVE — AB

## 2022-01-01 LAB — HIV ANTIBODY (ROUTINE TESTING W REFLEX): HIV Screen 4th Generation wRfx: NONREACTIVE

## 2022-01-01 LAB — RPR: RPR Ser Ql: NONREACTIVE

## 2022-11-15 ENCOUNTER — Other Ambulatory Visit: Payer: Self-pay

## 2022-11-15 ENCOUNTER — Encounter (HOSPITAL_BASED_OUTPATIENT_CLINIC_OR_DEPARTMENT_OTHER): Payer: Self-pay

## 2022-11-15 ENCOUNTER — Emergency Department (HOSPITAL_COMMUNITY): Payer: Self-pay

## 2022-11-15 ENCOUNTER — Ambulatory Visit (HOSPITAL_COMMUNITY): Admission: EM | Admit: 2022-11-15 | Discharge: 2022-11-15 | Discharge status: Against Medical Advice | Payer: Self-pay

## 2022-11-15 ENCOUNTER — Encounter (HOSPITAL_COMMUNITY): Payer: Self-pay | Admitting: *Deleted

## 2022-11-15 ENCOUNTER — Emergency Department (HOSPITAL_BASED_OUTPATIENT_CLINIC_OR_DEPARTMENT_OTHER)
Admission: EM | Admit: 2022-11-15 | Discharge: 2022-11-15 | Disposition: A | Discharge status: Home / Self Care | Payer: Self-pay | Attending: Emergency Medicine | Admitting: Emergency Medicine

## 2022-11-15 ENCOUNTER — Emergency Department (HOSPITAL_COMMUNITY)
Admission: EM | Admit: 2022-11-15 | Discharge: 2022-11-15 | Disposition: A | Discharge status: Home / Self Care | Payer: Self-pay | Attending: Emergency Medicine | Admitting: Emergency Medicine

## 2022-11-15 DIAGNOSIS — K5900 Constipation, unspecified: Secondary | ICD-10-CM | POA: Insufficient documentation

## 2022-11-15 DIAGNOSIS — R369 Urethral discharge, unspecified: Secondary | ICD-10-CM | POA: Insufficient documentation

## 2022-11-15 DIAGNOSIS — R7401 Elevation of levels of liver transaminase levels: Secondary | ICD-10-CM | POA: Insufficient documentation

## 2022-11-15 LAB — COMPREHENSIVE METABOLIC PANEL
ALT: 69 U/L — ABNORMAL HIGH (ref 0–44)
AST: 56 U/L — ABNORMAL HIGH (ref 15–41)
Albumin: 4 g/dL (ref 3.5–5.0)
Alkaline Phosphatase: 134 U/L — ABNORMAL HIGH (ref 38–126)
Anion gap: 9 (ref 5–15)
BUN: 16 mg/dL (ref 6–20)
CO2: 25 mmol/L (ref 22–32)
Calcium: 9.1 mg/dL (ref 8.9–10.3)
Chloride: 103 mmol/L (ref 98–111)
Creatinine, Ser: 1.03 mg/dL (ref 0.61–1.24)
GFR, Estimated: >60 mL/min (ref >60)
Glucose, Bld: 90 mg/dL (ref 70–99)
Potassium: 4.3 mmol/L (ref 3.5–5.1)
Sodium: 137 mmol/L (ref 135–145)
Total Bilirubin: 0.5 mg/dL (ref 0.3–1.2)
Total Protein: 7.2 g/dL (ref 6.5–8.1)

## 2022-11-15 LAB — URINALYSIS, MICROSCOPIC (REFLEX): WBC, UA: >50 WBC/hpf (ref 0–5)

## 2022-11-15 LAB — URINALYSIS, ROUTINE W REFLEX MICROSCOPIC
Bilirubin Urine: NEGATIVE
Glucose, UA: NEGATIVE mg/dL
Ketones, ur: NEGATIVE mg/dL
Nitrite: NEGATIVE
Protein, ur: NEGATIVE mg/dL
Specific Gravity, Urine: >=1.03 (ref 1.005–1.030)
pH: 6 (ref 5.0–8.0)

## 2022-11-15 LAB — CBC WITH DIFFERENTIAL/PLATELET
Abs Immature Granulocytes: 0.03 10*3/uL (ref 0.00–0.07)
Basophils Absolute: 0.1 10*3/uL (ref 0.0–0.1)
Basophils Relative: 1 %
Eosinophils Absolute: 0.3 10*3/uL (ref 0.0–0.5)
Eosinophils Relative: 3 %
HCT: 46.1 % (ref 39.0–52.0)
Hemoglobin: 15.5 g/dL (ref 13.0–17.0)
Immature Granulocytes: 0 %
Lymphocytes Relative: 25 %
Lymphs Abs: 2.7 10*3/uL (ref 0.7–4.0)
MCH: 30.5 pg (ref 26.0–34.0)
MCHC: 33.6 g/dL (ref 30.0–36.0)
MCV: 90.6 fL (ref 80.0–100.0)
Monocytes Absolute: 1 10*3/uL (ref 0.1–1.0)
Monocytes Relative: 10 %
Neutro Abs: 6.7 10*3/uL (ref 1.7–7.7)
Neutrophils Relative %: 61 %
Platelets: 221 10*3/uL (ref 150–400)
RBC: 5.09 MIL/uL (ref 4.22–5.81)
RDW: 13.6 % (ref 11.5–15.5)
WBC: 10.8 10*3/uL — ABNORMAL HIGH (ref 4.0–10.5)
nRBC: 0 % (ref 0.0–0.2)

## 2022-11-15 LAB — LIPASE, BLOOD: Lipase: 27 U/L (ref 11–51)

## 2022-11-15 MED ORDER — CEFTRIAXONE SODIUM 500 MG IJ SOLR
500.0000 mg | Freq: Once | INTRAMUSCULAR | Status: AC
  Administered 2022-11-15: 500 mg via INTRAMUSCULAR
  Filled 2022-11-15: qty 500

## 2022-11-15 MED ORDER — SODIUM CHLORIDE 0.9 % IV BOLUS
1000.0000 mL | Freq: Once | INTRAVENOUS | Status: AC
  Administered 2022-11-15: 1000 mL via INTRAVENOUS

## 2022-11-15 MED ORDER — AZITHROMYCIN 250 MG PO TABS
1000.0000 mg | ORAL_TABLET | Freq: Once | ORAL | Status: AC
  Administered 2022-11-15: 1000 mg via ORAL
  Filled 2022-11-15: qty 4

## 2022-11-15 MED ORDER — LIDOCAINE HCL URETHRAL/MUCOSAL 2 % EX GEL
1.0000 | Freq: Once | CUTANEOUS | Status: AC
  Administered 2022-11-15: 1 via TOPICAL
  Filled 2022-11-15: qty 11

## 2022-11-15 MED ORDER — ACETAMINOPHEN 500 MG PO TABS
1000.0000 mg | ORAL_TABLET | Freq: Once | ORAL | Status: AC
  Administered 2022-11-15: 1000 mg via ORAL
  Filled 2022-11-15: qty 2

## 2022-11-15 MED ORDER — SODIUM CHLORIDE 0.9 % IV BOLUS: 500.0000 mL | Freq: Once | INTRAVENOUS | Status: DC

## 2022-11-15 MED ORDER — IOHEXOL 350 MG/ML SOLN
75.0000 mL | Freq: Once | INTRAVENOUS | Status: AC | PRN
  Administered 2022-11-15: 75 mL via INTRAVENOUS

## 2022-11-15 MED ORDER — STERILE WATER FOR INJECTION IJ SOLN
INTRAMUSCULAR | Status: AC
  Administered 2022-11-15: 1 mL
  Filled 2022-11-15: qty 10

## 2022-11-15 NOTE — ED Provider Notes (Signed)
[place] EMERGENCY DEPARTMENT AT [facility] Provider Note   CSN: [id number] Arrival date & time: 11/15/22  1124     History  Chief Complaint  Patient presents with   Constipation    William Miranda is a 37 y.o. male history of polysubstance abuse, alcohol abuse presented with 5 days of constipation.  Patient states that he has not had a regular bowel movement for the past 5 days and that he is only having liquid form of his rectum.  Patient states he feels that he is mass of stool at the edge of his rectum and just needs it to pass and symptoms will be relieved.  Patient states he thinks this is most likely from not drinking enough water recently.  Patient tried MiraLAX, Dulcolax, magnesium citrate no relief.  Patient denied fevers, hematochezia, weight loss, abdominal pain, nausea/vomiting, changes in sensation/motor skills  Home Medications Prior to Admission medications   Medication Sig Start Date End Date Taking? Authorizing Provider  meloxicam (MOBIC) 7.5 MG tablet Take 2 tablets (15 mg total) by mouth daily. 03/11/21   Garlon Hatchet, PA-C  methocarbamol (ROBAXIN) 500 MG tablet Take 1 tablet (500 mg total) by mouth 2 (two) times daily. 06/10/20   Khatri, Hina, PA-C  naproxen (NAPROSYN) 500 MG tablet Take 1 tablet (500 mg total) by mouth 2 (two) times daily. 06/10/20   Khatri, Hina, PA-C  potassium chloride SA (KLOR-CON) 20 MEQ tablet Take 2 tablets (40 mEq total) by mouth daily for 5 days. 03/21/19 12/25/19  Long, Arlyss Repress, MD      Allergies    Patient has no known allergies.    Review of Systems   Review of Systems  Gastrointestinal:  Positive for constipation.    Physical Exam Updated Vital Signs BP 116/87   Pulse 71   Temp (!) 97.3 F (36.3 C) (Oral)   Resp 17   Ht 5\' 11"  (1.803 m)   Wt 97.1 kg   SpO2 99%   BMI 29.85 kg/m  Physical Exam Constitutional:      General: He is not in acute distress. Cardiovascular:     Pulses: Normal pulses.   Abdominal:     Palpations: Abdomen is soft.     Tenderness: There is no abdominal tenderness. There is no guarding or rebound.  Genitourinary:    Comments: Chaperone: Shelia Media, NT No stool noted in the rectal vault No rectal abnormalities noted No masses felt Patient had severe pain that was out of proportion during exam Musculoskeletal:        General: Normal range of motion.  Skin:    General: Skin is warm and dry.     Capillary Refill: Capillary refill takes less than 2 seconds.     Comments: No overlying skin color changes  Neurological:     Mental Status: He is alert.     Comments: Sensation intact distally     ED Results / Procedures / Treatments   Labs (all labs ordered are listed, but only abnormal results are displayed) Labs Reviewed  CBC WITH DIFFERENTIAL/PLATELET - Abnormal; Notable for the following components:      Result Value   WBC 10.8 (*)    All other components within normal limits  COMPREHENSIVE METABOLIC PANEL - Abnormal; Notable for the following components:   AST 56 (*)    ALT 69 (*)    Alkaline Phosphatase 134 (*)    All other components within normal limits  LIPASE, BLOOD  EKG None  Radiology CT ABDOMEN PELVIS W CONTRAST  Result Date: 11/15/2022 CLINICAL DATA:  Constipation. EXAM: CT ABDOMEN AND PELVIS WITH CONTRAST TECHNIQUE: Multidetector CT imaging of the abdomen and pelvis was performed using the standard protocol following bolus administration of intravenous contrast. RADIATION DOSE REDUCTION: This exam was performed according to the departmental dose-optimization program which includes automated exposure control, adjustment of the mA and/or kV according to patient size and/or use of iterative reconstruction technique. CONTRAST:  75mL OMNIPAQUE IOHEXOL 350 MG/ML SOLN COMPARISON:  CT scan 06/10/2020 FINDINGS: Lower chest: The lung bases are clear of acute process. No pleural effusion or pulmonary lesions. The heart is normal in size.  No pericardial effusion. The distal esophagus and aorta are unremarkable. Hepatobiliary: No focal hepatic lesions or intrahepatic biliary dilatation. The gallbladder is normal. No common bile duct dilatation. Pancreas: Normal Spleen: Adrenals/Urinary Tract: The adrenal glands and kidneys are. Stomach/Bowel: The stomach, duodenum, small and terminal ileum are normal. The appendix is. Scattered fluid throughout the colon but no stool burden. No inflammatory changes, mass lesions or obstructive findings. Vascular/Lymphatic: The aorta is normal in caliber. No dissection. The branch vessels are patent. The major venous structures are patent. No mesenteric or retroperitoneal mass or adenopathy. Small scattered lymph nodes are noted. Reproductive: The prostate gland and seminal vesicles are unremarkable. Other: No pelvic mass or adenopathy. No free pelvic fluid collections. No inguinal mass or adenopathy. No abdominal wall hernia or subcutaneous lesions. Musculoskeletal: No significant bony findings. IMPRESSION: 1. No acute abdominal/pelvic findings, mass lesions or adenopathy. 2. Scattered fluid throughout the colon but no stool burden. Electronically Signed   By: Rudie Meyer M.D.   On: 11/15/2022 15:38    Procedures Procedures    Medications Ordered in ED Medications  lidocaine (XYLOCAINE) 2 % jelly 1 Application (1 Application Topical Given 11/15/22 1353)  acetaminophen (TYLENOL) tablet 1,000 mg (1,000 mg Oral Given 11/15/22 1353)  sodium chloride 0.9 % bolus 1,000 mL (1,000 mLs Intravenous New Bag/Given 11/15/22 1356)  iohexol (OMNIPAQUE) 350 MG/ML injection 75 mL (75 mLs Intravenous Contrast Given 11/15/22 1521)    ED Course/ Medical Decision Making/ A&P                             Medical Decision Making Amount and/or Complexity of Data Reviewed Labs: ordered. Radiology: ordered.  Risk OTC drugs. Prescription drug management.   [facility] 37 y.o. presented today for constipation.  Working DDx that I considered at this time includes, but not limited to, constipation secondary to drug use, dehydration, fecal impaction, SBO/LBO, metastasis, volvulus, anal fissure/hemorrhoids, electrolyte abnormalities, colitis.  R/o DDx: constipation secondary to drug use, dehydration, fecal impaction, SBO/LBO, metastasis, volvulus, anal fissure/hemorrhoids, electrolyte abnormalities, colitis: These are considered less likely due to history of present illness and physical exam findings  Review of prior external notes: 11/15/2022 ED  Unique Tests and My Interpretation:  CBC: Unremarkable CMP: Unremarkable Lipase: Unremarkable CT abdomen pelvis with contrast: No acute intra-abdominal changes  Discussion with Independent Historian: None  Discussion of Management of Tests: None  Risk: Medium: prescription drug management  Risk Stratification Score: none  Plan: On exam patient was in no acute distress and stable vitals.  When I first entered the room patient was using the restroom try to have a bowel movement and stated he was able to pass some stool however was liquidy.  Patient did not have any abdominal pain or any systemic symptoms that  would indicate need CT scan at this time.  Patient stated that he just wants stool near his rectum to get out as he thinks that would resolve his symptoms.  I spoke to the patient about doing a fecal impaction versus an enema and patient stated he wanted to start with a fecal impaction.  Nurse to be notified and patient will try to be disimpacted.  Due to patient saying he has not been drinking water recently basic labs were ordered to evaluate for possible dehydration even though on exam patient does not appear clinically dehydrated.  Patient be given fluids as well.  Patient stable this time.  With a chaperone in the room fecal disimpaction was attempted.  Patient was extremely uncomfortable during the procedure outside of will be expected.  No stool was  noted in the rectal vault along with no masses or any other rectal abnormalities noted on exam.  Due to patient's out of proportion pain during disimpaction and history of polysubstance abuse a CT abdomen pelvis was ordered to further evaluate.  Patient's liver enzymes were slightly elevated and went to go talk to the patient he states that he does drink at least 1 beer a day which would explain patient's slight elevation in his transaminases.   Patient be given Tylenol at this time as opioids are being avoided as they may worsen his constipation.  Patient stable at this time.  Patient CT came back negative.  At this time patient's reassuring labs, no physical abnormalities on the rectal exam, reassuring imaging I do not suspect any life-threatening diagnosis.  Patient is able to have stools which is reassuring and we will encourage him to follow-up with his primary care provider.  I encouraged patient to drink plenty of fluids and to have high-fiber diet including 1 cup of bran daily or Metamucil 1 to 2 teaspoons 3 times daily with plenty of exercise.  I also encouraged patient to use Dulcolax, magnesium citrate, MiraLAX if symptoms do persist.  Patient was given return precautions. Patient stable for discharge at this time.  Patient verbalized understanding of plan.          Final Clinical Impression(s) / ED Diagnoses Final diagnoses:  Constipation, unspecified constipation type    Rx / DC Orders ED Discharge Orders     None         Netta Corrigan, PA-C 11/15/22 1550    Elayne Snare K, DO 11/15/22 1623

## 2022-11-15 NOTE — ED Triage Notes (Addendum)
Here by POV from home with family for constipation, last BM 5d ago, no relief with home meds including mag citrate, miralax, duralax. Describes as blocked up and diarrhea at the same time. Endorses decreased water intake recently. Denies fever, vomiting, abd pain, back pain, sob, syncope, bleeding, or urinary sx. Reports leaking stool now.

## 2022-11-15 NOTE — ED Triage Notes (Signed)
Pt arrives with c/o constipation x5 days and penile discharge for x3 days. Per pt, pain radiates into his testicles.

## 2022-11-15 NOTE — Discharge Instructions (Signed)
Please drink plenty of fluids and to have high-fiber diet including 1 cup of bran daily or Metamucil 1 to 2 teaspoons 3 times daily with plenty of exercise.  You may also use MiraLAX over-the-counter if these do not work.  Please follow-up with a primary care provider potentially for your one of your choosing to be reevaluated symptoms may change.  Today your labs and imaging are all reassuring.  If symptoms change or worsen please return to ER.

## 2022-11-15 NOTE — ED Provider Notes (Signed)
  [place] EMERGENCY DEPARTMENT AT [facility] [place] Provider Note   CSN: [id number] Arrival date & time: 11/15/22  0221     History  Chief Complaint  Patient presents with   Constipation   Penile Discharge    [facility] William Miranda is a 37 y.o. male.  Patient is a 37 year old male presenting with complaints of urethral discharge and constipation.  This has been ongoing for the past several days and reports he has not had a bowel movement in 5 days.  He denies fevers or chills.  He describes some dysuria.  He reports his sexual partner is also having similar issues.  The history is provided by the patient.       Home Medications Prior to Admission medications   Medication Sig Start Date End Date Taking? Authorizing Provider  meloxicam (MOBIC) 7.5 MG tablet Take 2 tablets (15 mg total) by mouth daily. 03/11/21   Garlon Hatchet, PA-C  methocarbamol (ROBAXIN) 500 MG tablet Take 1 tablet (500 mg total) by mouth 2 (two) times daily. 06/10/20   Khatri, Hina, PA-C  naproxen (NAPROSYN) 500 MG tablet Take 1 tablet (500 mg total) by mouth 2 (two) times daily. 06/10/20   Khatri, Hina, PA-C  potassium chloride SA (KLOR-CON) 20 MEQ tablet Take 2 tablets (40 mEq total) by mouth daily for 5 days. 03/21/19 12/25/19  Long, Arlyss Repress, MD      Allergies    Patient has no known allergies.    Review of Systems   Review of Systems  All other systems reviewed and are negative.   Physical Exam Updated Vital Signs BP (!) 136/98   Pulse 83   Temp 98.1 F (36.7 C) (Oral)   Resp 20   Wt 93 kg   SpO2 98%   BMI 28.59 kg/m  Physical Exam Vitals and nursing note reviewed.  Constitutional:      Appearance: Normal appearance.  HENT:     Head: Normocephalic and atraumatic.  Abdominal:     General: Abdomen is flat.     Tenderness: There is no abdominal tenderness. There is no guarding.     Hernia: No hernia is present.  Genitourinary:    Comments: There is a purulent discharge noted  coming from the urethra.  There are no blisters or rash. Neurological:     Mental Status: He is alert and oriented to person, place, and time.     ED Results / Procedures / Treatments   Labs (all labs ordered are listed, but only abnormal results are displayed) Labs Reviewed  URINALYSIS, ROUTINE W REFLEX MICROSCOPIC  GC/CHLAMYDIA PROBE AMP ([place]) NOT AT [facility]    EKG None  Radiology No results found.  Procedures Procedures    Medications Ordered in ED Medications  azithromycin (ZITHROMAX) tablet 1,000 mg (has no administration in time range)  cefTRIAXone (ROCEPHIN) injection 500 mg (has no administration in time range)    ED Course/ Medical Decision Making/ A&P  Patient presenting with urethral discharge as described in the HPI.  There is purulent material coming from his urethra and I highly suspect an STD.  Patient to be presumptively with Rocephin and Zithromax pending culture results.  To follow-up as needed.  Final Clinical Impression(s) / ED Diagnoses Final diagnoses:  None    Rx / DC Orders ED Discharge Orders     None         Geoffery Lyons, MD 11/15/22 0403

## 2022-11-15 NOTE — Discharge Instructions (Signed)
Take magnesium citrate.  Drink the entire 10 ounce bottle for relief of constipation.  We will call you if your cultures indicate you require further treatment or need to take additional action.

## 2022-11-19 LAB — GC/CHLAMYDIA PROBE AMP (~~LOC~~) NOT AT ARMC
Chlamydia: NEGATIVE
Comment: NEGATIVE
Comment: NORMAL
Neisseria Gonorrhea: POSITIVE — AB

## 2023-04-28 ENCOUNTER — Encounter (HOSPITAL_COMMUNITY): Payer: Self-pay

## 2023-04-28 ENCOUNTER — Emergency Department (HOSPITAL_COMMUNITY): Payer: Self-pay

## 2023-04-28 ENCOUNTER — Other Ambulatory Visit: Payer: Self-pay

## 2023-04-28 ENCOUNTER — Emergency Department (HOSPITAL_COMMUNITY)
Admission: EM | Admit: 2023-04-28 | Discharge: 2023-04-28 | Disposition: A | Discharge status: Home / Self Care | Payer: Self-pay

## 2023-04-28 DIAGNOSIS — R519 Headache, unspecified: Secondary | ICD-10-CM | POA: Insufficient documentation

## 2023-04-28 DIAGNOSIS — D72829 Elevated white blood cell count, unspecified: Secondary | ICD-10-CM | POA: Insufficient documentation

## 2023-04-28 DIAGNOSIS — H6691 Otitis media, unspecified, right ear: Secondary | ICD-10-CM | POA: Insufficient documentation

## 2023-04-28 LAB — CBC WITH DIFFERENTIAL/PLATELET
Abs Immature Granulocytes: 0.06 10*3/uL (ref 0.00–0.07)
Basophils Absolute: 0.1 10*3/uL (ref 0.0–0.1)
Basophils Relative: 1 %
Eosinophils Absolute: 0.3 10*3/uL (ref 0.0–0.5)
Eosinophils Relative: 2 %
HCT: 45.4 % (ref 39.0–52.0)
Hemoglobin: 15.2 g/dL (ref 13.0–17.0)
Immature Granulocytes: 1 %
Lymphocytes Relative: 25 %
Lymphs Abs: 3 10*3/uL (ref 0.7–4.0)
MCH: 29.6 pg (ref 26.0–34.0)
MCHC: 33.5 g/dL (ref 30.0–36.0)
MCV: 88.3 fL (ref 80.0–100.0)
Monocytes Absolute: 1.3 10*3/uL — ABNORMAL HIGH (ref 0.1–1.0)
Monocytes Relative: 10 %
Neutro Abs: 7.5 10*3/uL (ref 1.7–7.7)
Neutrophils Relative %: 61 %
Platelets: 213 10*3/uL (ref 150–400)
RBC: 5.14 MIL/uL (ref 4.22–5.81)
RDW: 14 % (ref 11.5–15.5)
WBC: 12.2 10*3/uL — ABNORMAL HIGH (ref 4.0–10.5)
nRBC: 0 % (ref 0.0–0.2)

## 2023-04-28 LAB — BASIC METABOLIC PANEL
Anion gap: 9 (ref 5–15)
BUN: 12 mg/dL (ref 6–20)
CO2: 23 mmol/L (ref 22–32)
Calcium: 9.2 mg/dL (ref 8.9–10.3)
Chloride: 105 mmol/L (ref 98–111)
Creatinine, Ser: 0.96 mg/dL (ref 0.61–1.24)
GFR, Estimated: >60 mL/min (ref >60)
Glucose, Bld: 109 mg/dL — ABNORMAL HIGH (ref 70–99)
Potassium: 3.7 mmol/L (ref 3.5–5.1)
Sodium: 137 mmol/L (ref 135–145)

## 2023-04-28 MED ORDER — AMOXICILLIN-POT CLAVULANATE 875-125 MG PO TABS
1.0000 | ORAL_TABLET | Freq: Once | ORAL | Status: AC
  Administered 2023-04-28: 1 via ORAL
  Filled 2023-04-28: qty 1

## 2023-04-28 MED ORDER — PROCHLORPERAZINE EDISYLATE 10 MG/2ML IJ SOLN
10.0000 mg | Freq: Once | INTRAMUSCULAR | Status: AC
  Administered 2023-04-28: 10 mg via INTRAVENOUS
  Filled 2023-04-28: qty 2

## 2023-04-28 MED ORDER — KETOROLAC TROMETHAMINE 10 MG PO TABS: 10.0000 mg | ORAL_TABLET | Freq: Four times a day (QID) | ORAL | 0 refills | Status: AC | PRN

## 2023-04-28 MED ORDER — AMOXICILLIN-POT CLAVULANATE 875-125 MG PO TABS: 1.0000 | ORAL_TABLET | Freq: Two times a day (BID) | ORAL | 0 refills | Status: AC

## 2023-04-28 MED ORDER — DIPHENHYDRAMINE HCL 50 MG/ML IJ SOLN
25.0000 mg | Freq: Once | INTRAMUSCULAR | Status: AC
  Administered 2023-04-28: 25 mg via INTRAVENOUS
  Filled 2023-04-28: qty 1

## 2023-04-28 MED ORDER — METHOCARBAMOL 500 MG PO TABS: 500.0000 mg | ORAL_TABLET | Freq: Two times a day (BID) | ORAL | 0 refills | Status: AC

## 2023-04-28 MED ORDER — METHOCARBAMOL 500 MG PO TABS
1000.0000 mg | ORAL_TABLET | Freq: Once | ORAL | Status: AC
  Administered 2023-04-28: 1000 mg via ORAL
  Filled 2023-04-28: qty 2

## 2023-04-28 MED ORDER — KETOROLAC TROMETHAMINE 15 MG/ML IJ SOLN
15.0000 mg | Freq: Once | INTRAMUSCULAR | Status: AC
  Administered 2023-04-28: 15 mg via INTRAVENOUS
  Filled 2023-04-28: qty 1

## 2023-04-28 NOTE — ED Provider Notes (Signed)
[place] EMERGENCY DEPARTMENT AT [facility] Provider Note   CSN: [id number] Arrival date & time: 04/28/23  1124     History  Chief Complaint  Patient presents with   Neck Pain    William Miranda is a 37 y.o. male.  37 year old male with no reported past medical history presenting to the emergency department today with pain behind his right ear associated with some neck pain.  The patient states that this began 3 to 4 days ago.  He started having pain in the right side of his neck.  He states that the pain has now migrated behind his right ear.  He denies any fevers but has not been checking his temperature.  He denies any focal weakness, numbness, or tingling.  He has tried some anti-inflammatories as well as a muscle relaxer at home which did not really help.  He states that he normally does not get headaches.  He states he has not been having an occipital headache now for the past 2 to 3 days that is gradually worsened.  He came to the emergency department today due to these ongoing symptoms.   Neck Pain      Home Medications Prior to Admission medications   Medication Sig Start Date End Date Taking? Authorizing Provider  amoxicillin-clavulanate (AUGMENTIN) 875-125 MG tablet Take 1 tablet by mouth every 12 (twelve) hours. 04/28/23  Yes Durwin Glaze, MD  ketorolac (TORADOL) 10 MG tablet Take 1 tablet (10 mg total) by mouth every 6 (six) hours as needed. 04/28/23  Yes Durwin Glaze, MD  methocarbamol (ROBAXIN) 500 MG tablet Take 1 tablet (500 mg total) by mouth 2 (two) times daily. 04/28/23  Yes Durwin Glaze, MD  potassium chloride SA (KLOR-CON) 20 MEQ tablet Take 2 tablets (40 mEq total) by mouth daily for 5 days. 03/21/19 12/25/19  Long, Arlyss Repress, MD      Allergies    Patient has no known allergies.    Review of Systems   Review of Systems  Musculoskeletal:  Positive for neck pain.  All other systems reviewed and are negative.   Physical Exam Updated  Vital Signs BP (!) 116/90   Pulse 95   Temp 98.9 F (37.2 C)   Resp 16   Ht 5\' 11"  (1.803 m)   Wt 95.3 kg   SpO2 100%   BMI 29.29 kg/m  Physical Exam Vitals and nursing note reviewed.   Gen: NAD Eyes: PERRL, EOMI HEENT: no oropharyngeal swelling, there does appear to be an effusion behind the right TM with some mild erythema, the patient is tender over the mastoid process although I do not appreciate any significant swelling or redness in this area Neck: No meningismus, the patient actually states that he feels better with his neck flexed Resp: clear to auscultation bilaterally Card: RRR, no murmurs, rubs, or gallops Abd: nontender, nondistended Extremities: no calf tenderness, no edema Vascular: 2+ radial pulses bilaterally, 2+ DP pulses bilaterally Neuro: Cranial nerves intact, equal strength sensation throughout bilateral upper and lower extremities with no dysmetria on finger-nose testing, normal gait noted Skin: no rashes Psyc: acting appropriately   ED Results / Procedures / Treatments   Labs (all labs ordered are listed, but only abnormal results are displayed) Labs Reviewed  CBC WITH DIFFERENTIAL/PLATELET - Abnormal; Notable for the following components:      Result Value   WBC 12.2 (*)    Monocytes Absolute 1.3 (*)    All other components within  normal limits  BASIC METABOLIC PANEL - Abnormal; Notable for the following components:   Glucose, Bld 109 (*)    All other components within normal limits    EKG None  Radiology CT Head Wo Contrast  Result Date: 04/28/2023 CLINICAL DATA:  Provided history: Headache, increasing frequency or severity. EXAM: CT HEAD WITHOUT CONTRAST TECHNIQUE: Contiguous axial images were obtained from the base of the skull through the vertex without intravenous contrast. RADIATION DOSE REDUCTION: This exam was performed according to the departmental dose-optimization program which includes automated exposure control, adjustment of the mA  and/or kV according to patient size and/or use of iterative reconstruction technique. COMPARISON:  Head CT 08/04/2022. FINDINGS: Brain: Cerebral volume is normal. There is no acute intracranial hemorrhage. No demarcated cortical infarct. No extra-axial fluid collection. No evidence of an intracranial mass. No midline shift. Vascular: No hyperdense vessel. Skull: No calvarial fracture or aggressive osseous lesion. Sinuses/Orbits: No mass or acute finding within the imaged orbits. No significant paranasal sinus disease at the imaged levels. IMPRESSION: No evidence of an acute intracranial abnormality. Electronically Signed   By: Jackey Loge D.O.   On: 04/28/2023 16:57   CT TEMPORAL BONES WO CONTRAST  Result Date: 04/28/2023 CLINICAL DATA:  Provided history: Headache, increasing frequency or severity. Mastoiditis. EXAM: CT TEMPORAL BONES WITHOUT CONTRAST TECHNIQUE: Axial and coronal plane CT imaging of the petrous temporal bones was performed with thin-collimation image reconstruction. No intravenous contrast was administered. Multiplanar CT image reconstructions were also generated. RADIATION DOSE REDUCTION: This exam was performed according to the departmental dose-optimization program which includes automated exposure control, adjustment of the mA and/or kV according to patient size and/or use of iterative reconstruction technique. COMPARISON:  Same day non-contrast head CT 04/28/2023. FINDINGS: RIGHT TEMPORAL BONE External auditory canal: Unremarkable. Middle ear cavity: Normally aerated. The scutum and ossicles are normal. The tegmen tympani is intact. Inner ear structures: The cochlea, vestibule and semicircular canals are normal. The vestibular aqueduct is not enlarged. Internal auditory and facial nerve canals:  Unremarkable. Mastoid air cells: Normally aerated. LEFT TEMPORAL BONE External auditory canal: Minimal cerumen/debris. Middle ear cavity: Normally aerated. The scutum and ossicles are normal. The  tegmen tympani is intact. Inner ear structures: The cochlea, vestibule and semicircular canals are normal. The vestibular aqueduct is not enlarged. Internal auditory and facial nerve canals:  Unremarkable. Mastoid air cells: Normally aerated. Vascular: Normal non-contrast appearance of the carotid canals, jugular bulbs and sigmoid plates. Limited intracranial: Separately reported on concurrently performed non-contrast head CT. Visible orbits/paranasal sinuses: Unremarkable. Soft tissues: Unremarkable. IMPRESSION: 1. Minimal cerumen/debris within the left external auditory canal. 2. Otherwise unremarkable non-contrast CT appearance of the temporal bones. Electronically Signed   By: Jackey Loge D.O.   On: 04/28/2023 16:50    Procedures Procedures    Medications Ordered in ED Medications  prochlorperazine (COMPAZINE) injection 10 mg (10 mg Intravenous Given 04/28/23 1303)  diphenhydrAMINE (BENADRYL) injection 25 mg (25 mg Intravenous Given 04/28/23 1302)  methocarbamol (ROBAXIN) tablet 1,000 mg (1,000 mg Oral Given 04/28/23 1301)  ketorolac (TORADOL) 15 MG/ML injection 15 mg (15 mg Intravenous Given 04/28/23 1728)  amoxicillin-clavulanate (AUGMENTIN) 875-125 MG per tablet 1 tablet (1 tablet Oral Given 04/28/23 1844)    ED Course/ Medical Decision Making/ A&P                                 Medical Decision Making 37 year old male with no reported past medical  history presented to the emergency department today with headache and right-sided neck pain.  The patient does have some tenderness over the right mastoid process.  He is not having significant erythema or swelling noted.  I will further evaluate him here with a CT scan of his head to evaluate for intracranial hemorrhage or mass lesion as he reports that he does not normally get headaches.  Will also obtain a CT scan of his mastoids to evaluate for mastoiditis.  This may be due to tension headache or migraine as well.  I will give the patient a  migraine cocktail.  Will also give him methocarbamol here.  I will reevaluate for ultimate disposition.  The patient does not have any meningismus here on exam is otherwise well-appearing with stable vital signs.  His neurologic exam is reassuring.  I will reevaluate for ultimate disposition.  The patient does have a mild leukocytosis.  His imaging studies are unremarkable.  His labs are otherwise unremarkable.  On reassessment the patient's symptoms have resolved after the medications here.  Will treat him for the past otitis media having the erythema and effusion on exam.  I think that he is stable for discharge.  Will send him home with anti-inflammatories and muscle relaxers.  Amount and/or Complexity of Data Reviewed Labs: ordered. Radiology: ordered.  Risk Prescription drug management.           Final Clinical Impression(s) / ED Diagnoses Final diagnoses:  Right otitis media, unspecified otitis media type  Nonintractable headache, unspecified chronicity pattern, unspecified headache type    Rx / DC Orders ED Discharge Orders          Ordered    ketorolac (TORADOL) 10 MG tablet  Every 6 hours PRN        04/28/23 1936    methocarbamol (ROBAXIN) 500 MG tablet  2 times daily        04/28/23 1936    amoxicillin-clavulanate (AUGMENTIN) 875-125 MG tablet  Every 12 hours        04/28/23 1936              Durwin Glaze, MD 04/28/23 [phone]

## 2023-04-28 NOTE — Discharge Instructions (Signed)
Your workup today was reassuring.  It looks like you may have an ear infection.  Please take the antibiotic as prescribed.  Take the anti-inflammatory every 6 hours as needed.  Take the muscle relaxers every 12 hours as needed.  Do not drive or drink alcohol taking the muscle relaxers may make you drowsy.  Follow-up with your doctor.  Return to the ER for worsening symptoms.

## 2023-04-28 NOTE — ED Triage Notes (Addendum)
Pt arrived POV from home c/o neck pain that started 2 days ago. Initially he thought he just slept wrong on it but then he noticed it was swollen behind his right ear and now his right side of the neck and now the pain is moving to the left side of his neck and ear. Pt also states it is hard to swallow especially if he is laying back.
